# Patient Record
Sex: Male | Born: 1970 | Race: White | Hispanic: No | Marital: Married | Smoking: Current some day smoker
Health system: Southern US, Community
[De-identification: ages and names within clinical notes are randomized; demographics above are authoritative.]

## PROBLEM LIST (undated history)

## (undated) DIAGNOSIS — G4733 Obstructive sleep apnea (adult) (pediatric): Secondary | ICD-10-CM

## (undated) DIAGNOSIS — E079 Disorder of thyroid, unspecified: Secondary | ICD-10-CM

## (undated) DIAGNOSIS — F411 Generalized anxiety disorder: Secondary | ICD-10-CM

## (undated) DIAGNOSIS — R5383 Other fatigue: Secondary | ICD-10-CM

## (undated) DIAGNOSIS — E669 Obesity, unspecified: Secondary | ICD-10-CM

## (undated) HISTORY — DX: Obstructive sleep apnea (adult) (pediatric): G47.33

## (undated) HISTORY — DX: Obesity, unspecified: E66.9

## (undated) HISTORY — DX: Other fatigue: R53.83

## (undated) HISTORY — DX: Disorder of thyroid, unspecified: E07.9

## (undated) HISTORY — DX: Generalized anxiety disorder: F41.1

---

## 2008-07-22 ENCOUNTER — Ambulatory Visit: Payer: Self-pay | Admitting: Obstetrics and Gynecology

## 2014-11-06 ENCOUNTER — Encounter: Payer: Self-pay | Admitting: Family Medicine

## 2014-11-06 ENCOUNTER — Ambulatory Visit (INDEPENDENT_AMBULATORY_CARE_PROVIDER_SITE_OTHER): Payer: BLUE CROSS/BLUE SHIELD | Admitting: Family Medicine

## 2014-11-06 ENCOUNTER — Ambulatory Visit (INDEPENDENT_AMBULATORY_CARE_PROVIDER_SITE_OTHER): Payer: BLUE CROSS/BLUE SHIELD | Admitting: *Deleted

## 2014-11-06 VITALS — BP 112/80 | HR 99 | Temp 98.1°F | Ht 71.0 in | Wt 237.1 lb

## 2014-11-06 DIAGNOSIS — M545 Low back pain: Secondary | ICD-10-CM

## 2014-11-06 DIAGNOSIS — F411 Generalized anxiety disorder: Secondary | ICD-10-CM | POA: Insufficient documentation

## 2014-11-06 DIAGNOSIS — Z23 Encounter for immunization: Secondary | ICD-10-CM

## 2014-11-06 DIAGNOSIS — Z7689 Persons encountering health services in other specified circumstances: Secondary | ICD-10-CM

## 2014-11-06 DIAGNOSIS — E039 Hypothyroidism, unspecified: Secondary | ICD-10-CM

## 2014-11-06 DIAGNOSIS — Z7189 Other specified counseling: Secondary | ICD-10-CM

## 2014-11-06 HISTORY — DX: Generalized anxiety disorder: F41.1

## 2014-11-06 LAB — BASIC METABOLIC PANEL
BUN: 15 mg/dL (ref 6–23)
CO2: 26 mEq/L (ref 19–32)
CREATININE: 0.9 mg/dL (ref 0.4–1.5)
Calcium: 9.5 mg/dL (ref 8.4–10.5)
Chloride: 108 mEq/L (ref 96–112)
GFR: 98.1 mL/min (ref >60.00)
Glucose, Bld: 88 mg/dL (ref 70–99)
Potassium: 3.6 mEq/L (ref 3.5–5.1)
Sodium: 142 mEq/L (ref 135–145)

## 2014-11-06 LAB — TSH: TSH: 6.2 u[IU]/mL — AB (ref 0.35–4.50)

## 2014-11-06 LAB — HEMOGLOBIN A1C: HEMOGLOBIN A1C: 5.3 % (ref 4.6–6.5)

## 2014-11-06 NOTE — Progress Notes (Signed)
Pre visit review using our clinic review tool, if applicable. No additional management support is needed unless otherwise documented below in the visit note. 

## 2014-11-06 NOTE — Progress Notes (Signed)
HPI:  [name] [name] is here to establish care. Has not had a primary care doctor in 4-5 years.   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 Thyroid disease   . Fatigue     History reviewed. No pertinent past surgical history.  Family History  Problem Relation Age of Onset  . Hyperlipidemia Mother   . Hyperlipidemia Father   . Diabetes Father   . Diabetes Maternal Grandmother   . Hyperlipidemia Maternal Grandmother   . Heart disease Maternal Grandmother   . Thyroid disease Maternal Uncle     and maternal grandmother    History   Social History  . Marital Status: Married    Spouse Name: N/A    Number of Children: N/A  . Years of Education: N/A   Social History Main Topics  . Smoking status: Former Games developermoker  . Smokeless tobacco: None     Comment: smoked on and off for 10 years, quit in2005  . Alcohol Use: None  . Drug Use: None  . Sexual Activity: None   Other Topics Concern  . 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  EXAM:  Filed Vitals:   11/06/14 1440  BP: 112/80  Pulse: 99  Temp: 98.1 F (36.7 C)    Body mass index is 33.08 kg/(m^2).  GENERAL: vitals reviewed and listed above, alert, oriented, appears well hydrated and in no acute distress  HEENT: atraumatic, conjunttiva clear, no obvious abnormalities on inspection of external nose and ears  NECK: no obvious masses on inspection, no thyroid masses palpated  LUNGS: clear to auscultation bilaterally, no wheezes, rales or rhonchi, good air movement  CV: HRRR, no peripheral edema  MS: moves all extremities without noticeable abnormality  PSYCH: pleasant and cooperative, no obvious depression or anxiety  ASSESSMENT AND PLAN:  Discussed the following assessment and plan:  Generalized anxiety disorder - Plan: Basic metabolic panel, Hemoglobin A1c - mild, opted not to pursue tx as feels will improve with current transition  Hypothyroidism, unspecified hypothyroidism type - Plan: TSH, Basic metabolic panel, Hemoglobin A1c  Encounter to establish care - Plan: Basic metabolic panel, Hemoglobin A1c  Right low back pain, with sciatica presence unspecified -HEP, did not get to examine today due to time, will examine next visit  -  We reviewed the PMH, PSH, FH, SH, Meds and Allergies. -We provided refills for any medications we will prescribe as needed. -We addressed current concerns per orders and patient instructions. -We have asked for records for pertinent exams, studies, vaccines and notes from previous providers. -We have advised patient to follow up per instructions below.   -Patient advised to return or notify a doctor immediately if symptoms worsen or persist or new concerns arise.  There are no Patient Instructions  on file for this visit.   Kriste Basque R.

## 2014-11-06 NOTE — Patient Instructions (Signed)
BEFORE YOU LEAVE: -flu shot -labs -low back exercises -schedule follow up in 1 month  -We have ordered labs or studies at this visit. It can take up to 1-2 weeks for results and processing. We will contact you with instructions IF your results are abnormal. Normal results will be released to your [facility]. If you have not heard from us or can not find your results in [facility] in 2 weeks please contact our office.  -PLEASE SIGN UP FOR MYCHART TODAY   We recommend the following healthy lifestyle measures: - eat a healthy diet consisting of lots of vegetables, fruits, beans, nuts, seeds, healthy meats such as white chicken and fish and whole grains.  - avoid fried foods, fast food, processed foods, sodas, red meet and other fattening foods.  - get a least 150 minutes of aerobic exercise per week.

## 2014-11-10 ENCOUNTER — Encounter: Payer: Self-pay | Admitting: Family Medicine

## 2014-11-12 ENCOUNTER — Other Ambulatory Visit: Payer: Self-pay | Admitting: *Deleted

## 2014-11-12 MED ORDER — LEVOTHYROXINE SODIUM 50 MCG PO TABS: 50.0000 ug | ORAL_TABLET | Freq: Every day | ORAL | Status: DC

## 2014-11-12 NOTE — Telephone Encounter (Signed)
Rx done and the pt was informed via Mychart. 

## 2015-02-10 ENCOUNTER — Other Ambulatory Visit: Payer: Self-pay | Admitting: Family Medicine

## 2015-03-19 ENCOUNTER — Ambulatory Visit (INDEPENDENT_AMBULATORY_CARE_PROVIDER_SITE_OTHER): Payer: BLUE CROSS/BLUE SHIELD | Admitting: Family Medicine

## 2015-03-19 ENCOUNTER — Encounter: Payer: Self-pay | Admitting: Family Medicine

## 2015-03-19 VITALS — BP 100/72 | HR 71 | Temp 97.9°F | Ht 71.25 in | Wt 232.7 lb

## 2015-03-19 DIAGNOSIS — Z6832 Body mass index (BMI) 32.0-32.9, adult: Secondary | ICD-10-CM | POA: Diagnosis not present

## 2015-03-19 DIAGNOSIS — E039 Hypothyroidism, unspecified: Secondary | ICD-10-CM | POA: Diagnosis not present

## 2015-03-19 DIAGNOSIS — Z0000 Encounter for general adult medical examination without abnormal findings: Secondary | ICD-10-CM

## 2015-03-19 DIAGNOSIS — F39 Unspecified mood [affective] disorder: Secondary | ICD-10-CM

## 2015-03-19 LAB — LIPID PANEL
CHOL/HDL RATIO: 5
Cholesterol: 173 mg/dL (ref 0–200)
HDL: 36.7 mg/dL — ABNORMAL LOW (ref >39.00)
LDL Cholesterol: 114 mg/dL — ABNORMAL HIGH (ref 0–99)
NONHDL: 136.3
Triglycerides: 114 mg/dL (ref 0.0–149.0)
VLDL: 22.8 mg/dL (ref 0.0–40.0)

## 2015-03-19 LAB — TSH: TSH: 4.36 u[IU]/mL (ref 0.35–4.50)

## 2015-03-19 NOTE — Patient Instructions (Signed)
BEFORE YOU LEAVE: -labs -follow up in 4 months  Schedule appointment with Dr. Jason FilaBray  We recommend the following healthy lifestyle measures: - eat a healthy diet consisting of lots of vegetables, fruits, beans, nuts, seeds, healthy meats such as white chicken and fish   - avoid starches, sweets, fried foods, fast food, processed foods, sodas, red meet and other fattening foods.  - get a least 150 minutes of aerobic exercise per week.

## 2015-03-19 NOTE — Progress Notes (Signed)
Pre visit review using our clinic review tool, if applicable. No additional management support is needed unless otherwise documented below in the visit note. 

## 2015-03-19 NOTE — Progress Notes (Signed)
HPI:  Here for CPE:  -Concerns and/or follow up today:   Mild Hypothyroidism: -advised re-starting synthroid last visit due to symtpoms -reports: doing ok  -denies: constipation, palpitations weight changes, hot or cold intol  Depressed Mood: For at least 6 months -depressed mood, worries about everything, irritable Sleep disorder: yes Interest deficit/anhedonia: yes Guilt (worthlessness, hopelessness, regret):sometimes Energy deficit: yes Concentration deficit: yes Appetite disorder: yes a little Psychomotor retardation or agitation: sluggish Suicidality: no  -Diet: variety of foods, balance and well rounded, sometimes eats too much  -Exercise: no regular exercise  -Diabetes and Dyslipidemia Screening: FASTING  -Hx of HTN: no  -Vaccines: UTD  -sexual activity: yes, male partner, no new partners  -wants STI testing, Hep C screening (if born [id number]): declined  -FH colon or prstate ca: see FH Last colon cancer screening: n/a Last prostate ca screening: n/a  -Alcohol, Tobacco, drug use: see social history  Review of Systems - no fevers, unintentional weight loss, vision loss, hearing loss, chest pain, sob, hemoptysis, melena, hematochezia, hematuria, genital discharge, changing or concerning skin lesions, bleeding, bruising, loc, thoughts of self harm or SI  Past Medical History  Diagnosis Date  . Thyroid disease   . Fatigue     No past surgical history on file.  Family History  Problem Relation Age of Onset  . Hyperlipidemia Mother   . Hyperlipidemia Father   . Diabetes Father   . Diabetes Maternal Grandmother   . Hyperlipidemia Maternal Grandmother   . Heart disease Maternal Grandmother   . Thyroid disease Maternal Uncle     and maternal grandmother    History   Social History  . Marital Status: Married    Spouse Name: N/A  . Number of Children: N/A  . Years of Education: N/A   Social History Main Topics  . Smoking status: Former Games developermoker   . Smokeless tobacco: Not on file     Comment: smoked on and off for 10 years, quit in2005  . Alcohol Use: Not on file  . Drug Use: Not on file  . Sexual Activity: Not on file   Other Topics Concern  . 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  levothyroxine (SYNTHROID, LEVOTHROID) 50 MCG tablet, TAKE 1 TABLET BY MOUTH DAILY, Disp: 30 tablet, Rfl: 1  EXAM:  Filed Vitals:   03/19/15 0925  BP: 100/72  Pulse: 71  Temp: 97.9 F (36.6 C)  TempSrc: Oral  Height: 5' 11.25" (1.81 m)  Weight: 232 lb 11.2 oz (105.552 kg)    Estimated body mass index is 32.22 kg/(m^2) as calculated from the following:   Height as of this encounter: 5' 11.25" (1.81 m).   Weight as of this encounter: 232 lb 11.2 oz (105.552 kg).  GENERAL: vitals reviewed and listed below, alert, oriented, appears well hydrated and in no acute distress  HEENT: head atraumatic, PERRLA, normal appearance of eyes, ears, nose and mouth. moist mucus membranes.  NECK: supple, no masses or lymphadenopathy  LUNGS: clear to auscultation bilaterally, no rales, rhonchi or wheeze  CV: HRRR, no peripheral edema or cyanosis, normal pedal pulses  ABDOMEN: bowel  sounds normal, soft, non tender to palpation, no masses, no rebound or guarding  GU: declined  RECTAL: declined  SKIN: no rash or abnormal lesions  MS: normal gait, moves all extremities normally  NEURO: CN II-XII grossly intact, normal muscle strength and sensation to light touch on extremities  PSYCH: normal affect, pleasant and cooperative  ASSESSMENT AND PLAN:  Discussed the following assessment and plan:  Visit for preventive health examination - Plan: TSH, Lipid  Panel -Discussed and advised all [place] preventive services health task force level A and B recommendations for age, sex and risks. -Advised at least 150 minutes of exercise per week and a healthy diet low in saturated fats and sweets and consisting of fresh fruits and vegetables, lean meats such as fish and white chicken and whole grains. -FASTING labs, studies and vaccines per orders this encounter  Mood disorder -discussed options -he opted for trial CBT, Dr. Janalyn ShyBray's information provided  BMI 32.0-32.9,adult -diet and exercise advised  Hypothyroidism, unspecified hypothyroidism type -check TSH    Patient advised to return to clinic immediately if symptoms worsen or persist or new concerns.  Patient Instructions  BEFORE YOU LEAVE: -labs -follow up in 4 months  Schedule appointment with Dr. Jason FilaBray  We recommend the following healthy lifestyle measures: - eat a healthy diet consisting of lots of vegetables, fruits, beans, nuts, seeds, healthy meats such as white chicken and fish   - avoid starches, sweets, fried foods, fast food, processed foods, sodas, red meet and other fattening foods.  - get a least 150 minutes of aerobic exercise per week.      No Follow-up on file.   Kriste BasqueKIM, Ajooni Karam R.

## 2015-03-26 ENCOUNTER — Other Ambulatory Visit: Payer: Self-pay | Admitting: Family Medicine

## 2015-03-26 DIAGNOSIS — E039 Hypothyroidism, unspecified: Secondary | ICD-10-CM

## 2015-05-19 ENCOUNTER — Other Ambulatory Visit: Payer: Self-pay | Admitting: Family Medicine

## 2015-06-15 ENCOUNTER — Encounter: Payer: Self-pay | Admitting: Family Medicine

## 2015-06-15 ENCOUNTER — Other Ambulatory Visit: Payer: Self-pay | Admitting: *Deleted

## 2015-06-15 MED ORDER — LEVOTHYROXINE SODIUM 50 MCG PO TABS: 50.0000 ug | ORAL_TABLET | Freq: Every day | ORAL | Status: DC

## 2015-06-15 NOTE — Telephone Encounter (Signed)
Rx done and pt informed via Mychart. 

## 2015-07-29 ENCOUNTER — Encounter: Payer: Self-pay | Admitting: Family Medicine

## 2015-07-29 ENCOUNTER — Other Ambulatory Visit: Payer: Self-pay | Admitting: *Deleted

## 2015-07-29 DIAGNOSIS — R0683 Snoring: Secondary | ICD-10-CM

## 2015-08-08 ENCOUNTER — Other Ambulatory Visit: Payer: Self-pay | Admitting: Family Medicine

## 2015-08-18 ENCOUNTER — Ambulatory Visit (INDEPENDENT_AMBULATORY_CARE_PROVIDER_SITE_OTHER): Payer: BLUE CROSS/BLUE SHIELD | Admitting: Family Medicine

## 2015-08-18 ENCOUNTER — Encounter: Payer: Self-pay | Admitting: Family Medicine

## 2015-08-18 ENCOUNTER — Other Ambulatory Visit: Payer: Self-pay | Admitting: *Deleted

## 2015-08-18 ENCOUNTER — Ambulatory Visit (INDEPENDENT_AMBULATORY_CARE_PROVIDER_SITE_OTHER)
Admission: RE | Admit: 2015-08-18 | Discharge: 2015-08-18 | Disposition: A | Discharge status: Home / Self Care | Payer: BLUE CROSS/BLUE SHIELD | Source: Ambulatory Visit | Attending: Family Medicine | Admitting: Family Medicine

## 2015-08-18 VITALS — BP 110/82 | HR 87 | Temp 98.1°F | Ht 71.25 in | Wt 228.6 lb

## 2015-08-18 DIAGNOSIS — E039 Hypothyroidism, unspecified: Secondary | ICD-10-CM

## 2015-08-18 DIAGNOSIS — J209 Acute bronchitis, unspecified: Secondary | ICD-10-CM

## 2015-08-18 DIAGNOSIS — J069 Acute upper respiratory infection, unspecified: Secondary | ICD-10-CM

## 2015-08-18 LAB — TSH: TSH: 5.41 u[IU]/mL — AB (ref 0.35–4.50)

## 2015-08-18 MED ORDER — DOXYCYCLINE HYCLATE 100 MG PO TABS: 100.0000 mg | ORAL_TABLET | Freq: Two times a day (BID) | ORAL | Status: DC

## 2015-08-18 MED ORDER — PREDNISONE 20 MG PO TABS: 40.0000 mg | ORAL_TABLET | Freq: Every day | ORAL | Status: DC

## 2015-08-18 NOTE — Progress Notes (Signed)
Pre visit review using our clinic review tool, if applicable. No additional management support is needed unless otherwise documented below in the visit note. 

## 2015-08-18 NOTE — Addendum Note (Signed)
Addended by: Johnella MoloneyFUNDERBURK, JO A on: 08/18/2015 10:09 AM   Modules accepted: Orders

## 2015-08-18 NOTE — Telephone Encounter (Signed)
Rx done and the pt was informed. 

## 2015-08-18 NOTE — Patient Instructions (Signed)
BEFORE YOU LEAVE: -lab -xray sheet  Go get xray today  Start the prednisone  INSTRUCTIONS FOR UPPER RESPIRATORY INFECTION:  -plenty of rest and fluids  -nasal saline wash 2-3 times daily (use prepackaged nasal saline or bottled/distilled water if making your own)   -can use AFRIN nasal spray for drainage and nasal congestion - but do NOT use longer then 3-4 days  -can use tylenol (in no history of liver disease) or ibuprofen (if no history of kidney disease, bowel bleeding or significant heart disease) as directed for aches and sorethroat  -in the winter time, using a humidifier at night is helpful (please follow cleaning instructions)  -if you are taking a cough medication - use only as directed, may also try a teaspoon of honey to coat the throat and throat lozenges. If given a cough medication with codeine or hydrocodone or other narcotic please be advised that this contains a strong and  potentially addicting medication. Please follow instructions carefully, take as little as possible and only use AS NEEDED for severe cough. Discuss potential side effects with your pharmacy. Please do not drive or operate machinery while taking these types of medications. Please do not take other sedating medications, drugs or alcohol while taking this medication without discussing with your doctor.  -for sore throat, salt water gargles can help  -follow up if you have fevers, facial pain, tooth pain, difficulty breathing or are worsening or symptoms persist longer then expected  Upper Respiratory Infection, Adult An upper respiratory infection (URI) is also known as the common cold. It is often caused by a type of germ (virus). Colds are easily spread (contagious). You can pass it to others by kissing, coughing, sneezing, or drinking out of the same glass. Usually, you get better in 1 to 3  weeks.  However, the cough can last for even longer. HOME CARE   Only take medicine as told by your doctor.  Follow instructions provided above.  Drink enough water and fluids to keep your pee (urine) clear or pale yellow.  Get plenty of rest.  Return to work when your temperature is < 100 for 24 hours or as told by your doctor. You may use a face mask and wash your hands to stop your cold from spreading. GET HELP RIGHT AWAY IF:   You are getting worse.  You have questions about your medicine.  You have chills, shortness of breath, or red spit (mucus).  You have pain in the face for more then 1-2 days, especially when you bend forward.  You have a fever, puffy (swollen) neck, pain when you swallow, or white spots in the back of your throat.  You have a bad headache, ear pain, sinus pain, or chest pain.  You have a high-pitched whistling sound when you breathe in and out (wheezing).  You cough up blood.  You have sore muscles or a stiff neck. MAKE SURE YOU:   Understand these instructions.  Will watch your condition.  Will get help right away if you are not doing well or get worse. Document Released: 04/03/2008 Document Revised: 01/08/2012 Document Reviewed: 01/21/2014 [facility] Patient Information 2015 [place], [place]. This information is not intended to replace advice given to you by your health care provider. Make sure you discuss any questions you have with your health care provider.

## 2015-08-18 NOTE — Progress Notes (Signed)
HPI:  URI: -started: 2 days ago -symptoms:nasal congestion, sore throat, cough, ? Mild chest tightness or SOB - feels like can't take a deep breath as well -denies:fever, sinus pain, NVD, tooth pain, CP with activity or DOE, long car or plane rides, surgeries -has tried: nothing -sick contacts/travel/risks: denies flu exposure, tick exposure or or Ebola risks -his whole family was sick and he was sick with a bad viral resp infection about one month ago and he also had some mild SOB with this then this had pretty much resolved before he got sick again a few days ago  ROS: See pertinent positives and negatives per HPI.  Past Medical History  Diagnosis Date  . Thyroid disease   . Fatigue     No past surgical history on file.  Family History  Problem Relation Age of Onset  . Hyperlipidemia Mother   . Hyperlipidemia Father   . Diabetes Father   . Diabetes Maternal Grandmother   . Hyperlipidemia Maternal Grandmother   . Heart disease Maternal Grandmother   . Thyroid disease Maternal Uncle     and maternal grandmother    Social History   Social History  . Marital Status: Married    Spouse Name: N/A  . Number of Children: N/A  . Years of Education: N/A   Social History Main Topics  . Smoking status: Former Games developer  . Smokeless tobacco: None     Comment: smoked on and off for 10 years, quit in2005  . Alcohol Use: None  . Drug Use: None  . Sexual Activity: Not Asked   Other Topics Concern  . 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  levothyroxine (SYNTHROID, LEVOTHROID) 50 MCG tablet, Take 1 tablet by mouth  daily, Disp: 90 tablet, Rfl: 0 .  predniSONE (DELTASONE) 20 MG  tablet, Take 2 tablets (40 mg total) by mouth daily with breakfast., Disp: 8 tablet, Rfl: 0  EXAM:  Filed Vitals:   08/18/15 0855  BP: 110/82  Pulse: 87  Temp: 98.1 F (36.7 C)    Body mass index is 31.65 kg/(m^2).  GENERAL: vitals reviewed and listed above, alert, oriented, appears well hydrated and in no acute distress  HEENT: atraumatic, conjunttiva clear, no obvious abnormalities on inspection of external nose and ears, normal appearance of ear canals and TMs, clear nasal congestion, mild post oropharyngeal erythema with PND, no tonsillar edema or exudate, no sinus TTP  NECK: no obvious masses on inspection  LUNGS: clear to auscultation bilaterally, no wheezes, rales or rhonchi, good air movement, O2 sats normal on RA, no signs of resp distress  CV: HRRR, no peripheral edema  MS: moves all extremities without noticeable abnormality  PSYCH: pleasant and cooperative, no obvious depression or anxiety  ASSESSMENT AND PLAN:  Discussed the following assessment and plan:  Acute upper respiratory infection  Acute bronchitis, unspecified organism  Hypothyroidism, unspecified hypothyroidism type - Plan: TSH  -given HPI and exam findings today, a serious infection or illness is unlikely. We discussed potential etiologies (including serious etiologies thought we both agreed these are unlikely), with VURI being most likely, and advised  supportive care and monitoring. We discussed treatment side effects, likely course, antibiotic misuse, transmission, and signs of developing a serious illness. -will get CXR to exclude PNA -check tsh as is doe for check -start short course prednisone (risks discussed)- advised to follow up if worsening or not improving as expected -of course, we advised to return or notify a doctor immediately if symptoms worsen or persist or new concerns arise.    Patient Instructions  BEFORE YOU LEAVE: -lab -xray sheet  Go get xray today  Start the  prednisone  INSTRUCTIONS FOR UPPER RESPIRATORY INFECTION:  -plenty of rest and fluids  -nasal saline wash 2-3 times daily (use prepackaged nasal saline or bottled/distilled water if making your own)   -can use AFRIN nasal spray for drainage and nasal congestion - but do NOT use longer then 3-4 days  -can use tylenol (in no history of liver disease) or ibuprofen (if no history of kidney disease, bowel bleeding or significant heart disease) as directed for aches and sorethroat  -in the winter time, using a humidifier at night is helpful (please follow cleaning instructions)  -if you are taking a cough medication - use only as directed, may also try a teaspoon of honey to coat the throat and throat lozenges. If given a cough medication with codeine or hydrocodone or other narcotic please be advised that this contains a strong and  potentially addicting medication. Please follow instructions carefully, take as little as possible and only use AS NEEDED for severe cough. Discuss potential side effects with your pharmacy. Please do not drive or operate machinery while taking these types of medications. Please do not take other sedating medications, drugs or alcohol while taking this medication without discussing with your doctor.  -for sore throat, salt water gargles can help  -follow up if you have fevers, facial pain, tooth pain, difficulty breathing or are worsening or symptoms persist longer then expected  Upper Respiratory Infection, Adult An upper respiratory infection (URI) is also known as the common cold. It is often caused by a type of germ (virus). Colds are easily spread (contagious). You can pass it to others by kissing, coughing, sneezing, or drinking out of the same glass. Usually, you get better in 1 to 3  weeks.  However, the cough can last for even longer. HOME CARE   Only take medicine as told by your doctor. Follow instructions provided above.  Drink enough water and fluids to  keep your pee (urine) clear or pale yellow.  Get plenty of rest.  Return to work when your temperature is < 100 for 24 hours or as told by your doctor. You may use a face mask and wash your hands to stop your cold from spreading. GET HELP RIGHT AWAY IF:   You are getting worse.  You have questions about your medicine.  You have chills, shortness of breath, or red spit (mucus).  You have pain in the face for more then 1-2 days, especially when you bend forward.  You have a fever, puffy (swollen) neck, pain when you swallow, or white spots in the back of your throat.  You have a bad headache, ear pain, sinus pain, or chest pain.  You have a high-pitched whistling sound when you breathe in and out (wheezing).  You cough up blood.  You have sore muscles or a stiff neck. MAKE SURE YOU:   Understand these instructions.  Will watch your condition.  Will get help right away if you are not  doing well or get worse. Document Released: 04/03/2008 Document Revised: 01/08/2012 Document Reviewed: 01/21/2014 [facility]Exit[facility] Patient Information 2015 [place]ExitCare, [place]LL[place]. This information is not intended to replace advice given to you by your health care provider. Make sure you discuss any questions you have with your health care provider.      Kriste BasqueKIM, HANNAH R.

## 2015-08-20 ENCOUNTER — Ambulatory Visit: Payer: BLUE CROSS/BLUE SHIELD | Admitting: Family Medicine

## 2015-09-15 MED ORDER — LEVOTHYROXINE SODIUM 75 MCG PO TABS: 75.0000 ug | ORAL_TABLET | Freq: Every day | ORAL | Status: DC

## 2015-09-15 NOTE — Addendum Note (Signed)
Addended by: Johnella MoloneyFUNDERBURK, JO A on: 09/15/2015 01:02 PM   Modules accepted: Orders, Medications

## 2015-09-21 ENCOUNTER — Encounter: Payer: Self-pay | Admitting: Family Medicine

## 2015-09-21 ENCOUNTER — Ambulatory Visit (INDEPENDENT_AMBULATORY_CARE_PROVIDER_SITE_OTHER): Payer: BLUE CROSS/BLUE SHIELD | Admitting: Family Medicine

## 2015-09-21 VITALS — BP 104/80 | HR 104 | Temp 98.0°F | Ht 71.25 in | Wt 233.3 lb

## 2015-09-21 DIAGNOSIS — F411 Generalized anxiety disorder: Secondary | ICD-10-CM | POA: Diagnosis not present

## 2015-09-21 DIAGNOSIS — K219 Gastro-esophageal reflux disease without esophagitis: Secondary | ICD-10-CM | POA: Diagnosis not present

## 2015-09-21 DIAGNOSIS — R0789 Other chest pain: Secondary | ICD-10-CM

## 2015-09-21 MED ORDER — ESOMEPRAZOLE MAGNESIUM 20 MG PO CPDR: 20.0000 mg | DELAYED_RELEASE_CAPSULE | Freq: Every day | ORAL | Status: DC

## 2015-09-21 NOTE — Progress Notes (Signed)
Pre visit review using our clinic review tool, if applicable. No additional management support is needed unless otherwise documented below in the visit note. 

## 2015-09-21 NOTE — Patient Instructions (Signed)
Please schedule follow up in 1 month  Take the Nexium daily for 2 weeks  Please schedule counseling to help with stress  We placed a referral for you as discussed for the exercise stress test. It usually takes about 1-2 weeks to process and schedule this referral. If you have not heard from us regarding this appointment in 2 weeks please contact our office.

## 2015-09-21 NOTE — Progress Notes (Signed)
HPI:  [name] [name] is a 44 yo M with a PMH hypothyroidism, on replacement, and a resp infection with possible mild pneumonia last month, treated with doxy and prednisone here today for an acute visit for chest pain. Reports has been under a lot of stress as is in the process of looking for a job and has had 4 jobs fall through and also is in marriage counseling. At night his is anxious and thinking about this and develops L sided chest pain that always occurs when lying in bed. Never occurs with exercise. He also has had worsening GERD with several bad occassions after eating salsa in the last few weeks. He reports complete resolutions of cough, congestion and SOB. Denies: fevers, chills, malaise, depression, dysphagia, vomiting, DOE, orthopnea.  ROS: See pertinent positives and negatives per HPI.  Past Medical History  Diagnosis Date  . Thyroid disease   . Fatigue     No past surgical history on file.  Family History  Problem Relation Age of Onset  . Hyperlipidemia Mother   . Hyperlipidemia Father   . Diabetes Father   . Diabetes Maternal Grandmother   . Hyperlipidemia Maternal Grandmother   . Heart disease Maternal Grandmother   . Thyroid disease Maternal Uncle     and maternal grandmother    Social History   Social History  . Marital Status: Married    Spouse Name: N/A  . Number of Children: N/A  . Years of Education: N/A   Social History Main Topics  . Smoking status: Former Games developer  . Smokeless tobacco: None     Comment: smoked on and off for 10 years, quit in2005  . Alcohol Use: None  . Drug Use: None  . Sexual Activity: Not Asked   Other Topics Concern  . 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  levothyroxine (SYNTHROID, LEVOTHROID) 75 MCG tablet, Take 1 tablet (75 mcg total) by mouth daily., Disp: 90 tablet, Rfl: 1 .  esomeprazole (NEXIUM) 20 MG capsule, Take 1 capsule (20 mg total) by mouth daily at 12 noon., Disp: 14 capsule, Rfl: 0  EXAM:  Filed Vitals:   09/21/15 1430  BP: 104/80  Pulse: 104  Temp: 98 F (36.7 C)    Body mass index is 32.3 kg/(m^2).  GENERAL: vitals reviewed and listed above, alert, oriented, appears well hydrated and in no acute distress  HEENT: atraumatic, conjunttiva clear, no obvious abnormalities on inspection of external nose and ears  NECK: no obvious masses on inspection  LUNGS: clear to auscultation bilaterally, no wheezes, rales or rhonchi, good air movement  CV: HRRR, no peripheral edema  MS: moves all extremities without noticeable abnormality, no chest wall pain  PSYCH: pleasant and cooperative, no obvious depression or anxiety  ASSESSMENT AND PLAN:  Discussed the following assessment and plan:  Atypical chest pain - Plan: EKG 12-Lead  Gastroesophageal reflux disease, esophagitis presence not specified  Generalized anxiety disorder  -this may be multifactorial and based on the symptoms sound more likely to be coming from GERD and/or anxiety -he does not want to take medications for anxiety - opted  for CBT -nexium short course for GERD -EKG with NSR -discussed repeating xray and may do if symptoms persist thought symptoms of PNA resolved -exercise stress test as he is worried  -Patient advised to return or notify a doctor immediately if symptoms worsen or persist or new concerns arise.  Patient Instructions  Please schedule follow up in 1 month  Take the Nexium daily for 2 weeks  Please schedule counseling to help with stress  We placed a referral for you as discussed for the exercise stress test. It usually takes about 1-2 weeks to process and schedule this referral. If you have not heard  from us regarding this appointment in 2 weeks please contact our office.      Kriste BasqueKIM, HANNAH R.

## 2015-10-19 ENCOUNTER — Ambulatory Visit (INDEPENDENT_AMBULATORY_CARE_PROVIDER_SITE_OTHER): Payer: BLUE CROSS/BLUE SHIELD | Admitting: Family Medicine

## 2015-10-19 ENCOUNTER — Other Ambulatory Visit: Payer: Self-pay | Admitting: Family Medicine

## 2015-10-19 ENCOUNTER — Ambulatory Visit (INDEPENDENT_AMBULATORY_CARE_PROVIDER_SITE_OTHER)
Admission: RE | Admit: 2015-10-19 | Discharge: 2015-10-19 | Disposition: A | Discharge status: Home / Self Care | Payer: BLUE CROSS/BLUE SHIELD | Source: Ambulatory Visit | Attending: Family Medicine | Admitting: Family Medicine

## 2015-10-19 ENCOUNTER — Encounter: Payer: Self-pay | Admitting: Family Medicine

## 2015-10-19 VITALS — BP 102/76 | HR 96 | Temp 97.6°F | Ht 71.25 in | Wt 228.6 lb

## 2015-10-19 DIAGNOSIS — R042 Hemoptysis: Secondary | ICD-10-CM

## 2015-10-19 DIAGNOSIS — J988 Other specified respiratory disorders: Secondary | ICD-10-CM

## 2015-10-19 MED ORDER — HYDROCODONE-HOMATROPINE 5-1.5 MG/5ML PO SYRP: 5.0000 mL | ORAL_SOLUTION | Freq: Two times a day (BID) | ORAL | Status: DC | PRN

## 2015-10-19 MED ORDER — AMOXICILLIN-POT CLAVULANATE 875-125 MG PO TABS: 1.0000 | ORAL_TABLET | Freq: Two times a day (BID) | ORAL | Status: DC

## 2015-10-19 NOTE — Patient Instructions (Signed)
BEFORE YOU LEAVE: -xray sheet  Go get xray - treatment pending results

## 2015-10-19 NOTE — Progress Notes (Signed)
Pre visit review using our clinic review tool, if applicable. No additional management support is needed unless otherwise documented below in the visit note. 

## 2015-10-19 NOTE — Progress Notes (Signed)
HPI:  Cough/Congestion: -started: 1 week ago, worsening the last 3-4 days -symptoms:nasal congestion, sore throat, cough, sinus pain, upper tooth pain, blood in sputum this morning -denies:fever, SOB, NVD -has tried: musinex -sick contacts/travel/risks: denies flu exposure, tick exposure or or Ebola risks, TB risks -Hx of: allergies ROS: See pertinent positives and negatives per HPI.  Past Medical History  Diagnosis Date  . Thyroid disease   . Fatigue     No past surgical history on file.  Family History  Problem Relation Age of Onset  . Hyperlipidemia Mother   . Hyperlipidemia Father   . Diabetes Father   . Diabetes Maternal Grandmother   . Hyperlipidemia Maternal Grandmother   . Heart disease Maternal Grandmother   . Thyroid disease Maternal Uncle     and maternal grandmother    Social History   Social History  . Marital Status: Married    Spouse Name: N/A  . Number of Children: N/A  . Years of Education: N/A   Social History Main Topics  . Smoking status: Former Games developermoker  . Smokeless tobacco: None     Comment: smoked on and off for 10 years, quit in2005  . Alcohol Use: None  . Drug Use: None  . Sexual Activity: Not Asked   Other Topics Concern  . 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  levothyroxine (SYNTHROID, LEVOTHROID) 75 MCG tablet, Take 1 tablet (75 mcg total) by mouth daily., Disp: 90 tablet, Rfl: 1 .  Pseudoephedrine-Guaifenesin (MUCINEX D PO), Take by mouth as needed., Disp: , Rfl:  .  HYDROcodone-homatropine (HYCODAN) 5-1.5 MG/5ML syrup, Take 5 mLs by mouth every 12 (twelve) hours as needed for cough., Disp: 120 mL, Rfl: 0  EXAM:  Filed Vitals:    10/19/15 1117  BP: 102/76  Pulse: 96  Temp: 97.6 F (36.4 C)    Body mass index is 31.65 kg/(m^2).  GENERAL: vitals reviewed and listed above, alert, oriented, appears well hydrated and in no acute distress  HEENT: atraumatic, conjunttiva clear, no obvious abnormalities on inspection of external nose and ears, normal appearance of ear canals and TMs, clear nasal congestion, mild post oropharyngeal erythema with PND, no tonsillar edema or exudate, no sinus TTP  NECK: no obvious masses on inspection  LUNGS: clear to auscultation bilaterally, no wheezes, rales or rhonchi, good air movement  CV: HRRR, no peripheral edema  MS: moves all extremities without noticeable abnormality  PSYCH: pleasant and cooperative, no obvious depression or anxiety  ASSESSMENT AND PLAN:  Discussed the following assessment and plan:  Respiratory infection - Plan: DG Chest 2 View  Hemoptysis - Plan: DG Chest 2 View  -we discussed possible serious and likely etiologies, workup and treatment, treatment risks and return precautions -after this discussion, Caryn BeeKevin opted for aabx for possible upper or lower resp infections - discussed viral etiology may also be possible but given he feels is worsening and has sinus pain opted for abx - will choose abx after CXR -cough medication provided -follow up advised as needed -of course, we advised Caryn BeeKevin  to return or notify a doctor immediately if symptoms worsen  or persist or new concerns arise.    Patient Instructions  BEFORE YOU LEAVE: -xray sheet  Go get xray - treatment pending results     Joren Rehm R.

## 2015-10-22 IMAGING — DX DG CHEST 2V
2 series · 2 of 2 positions shown · non-contrast
Comparison: None.

CLINICAL DATA: Cough and congestion.

EXAM:
CHEST  2 VIEW

[chest lat]
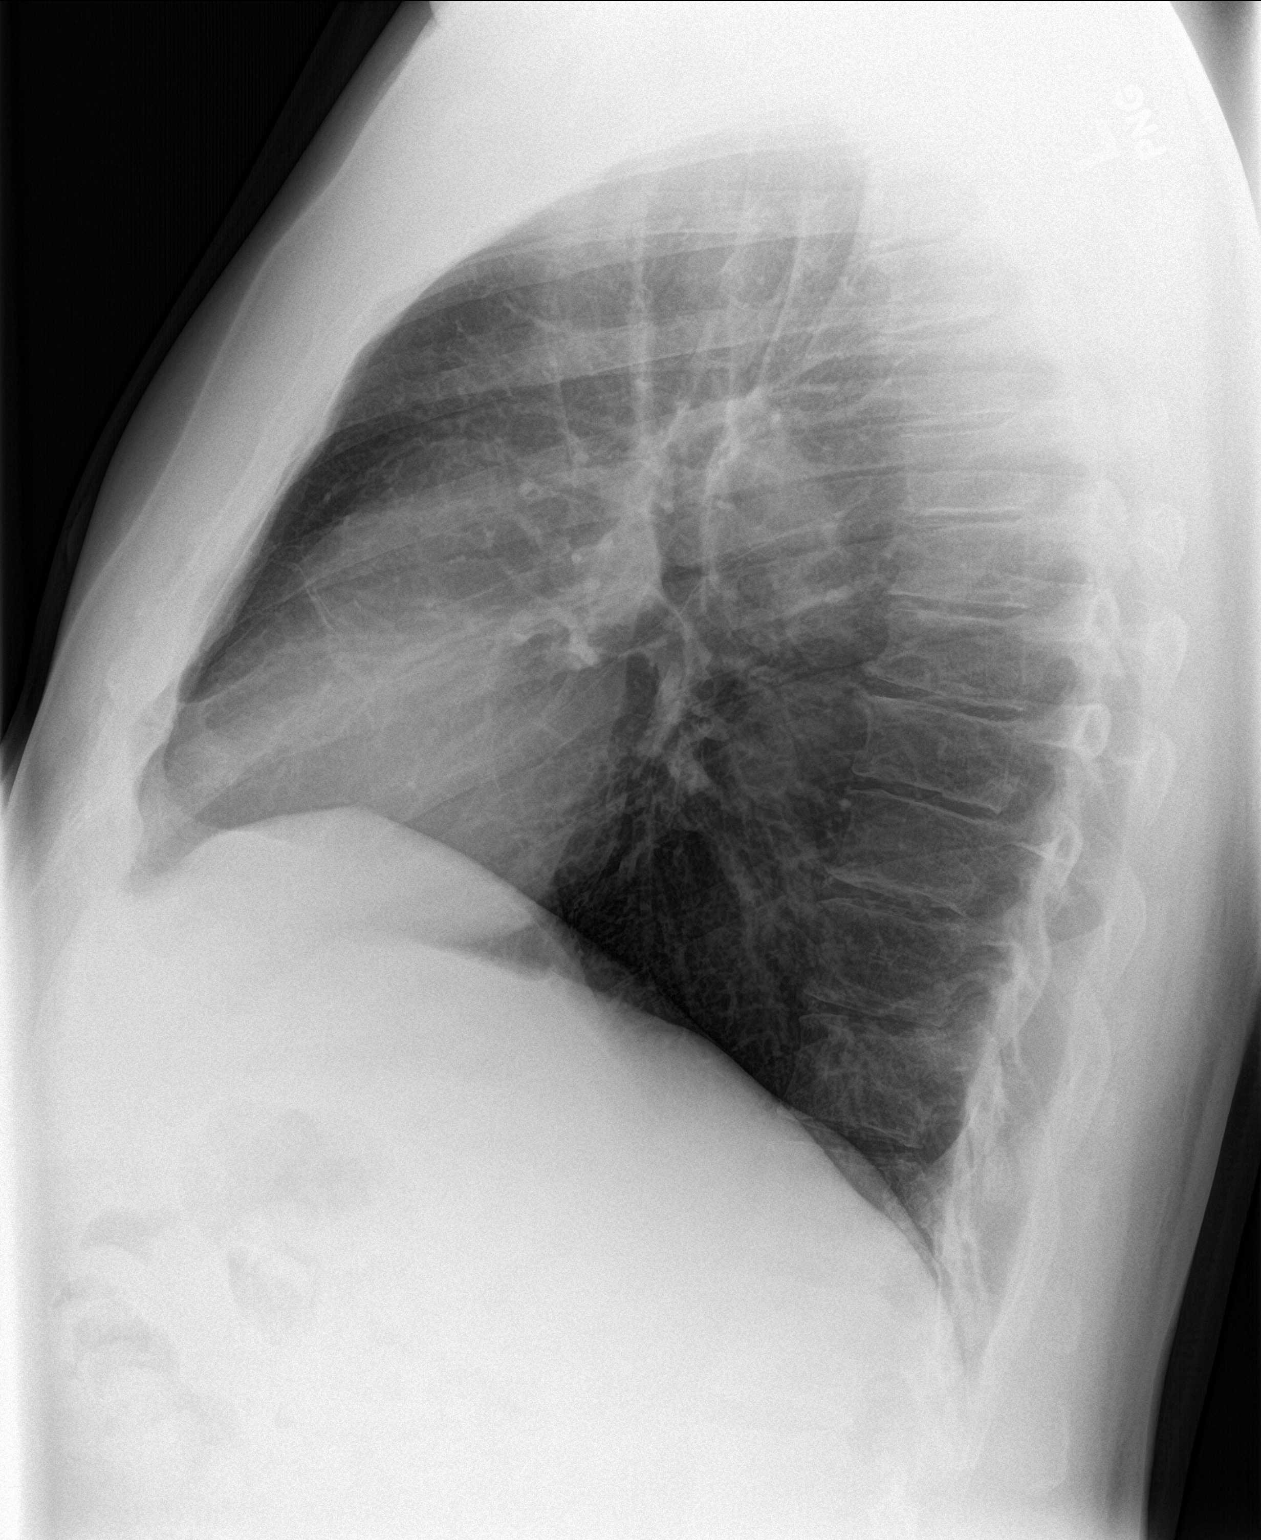

[chest pa]
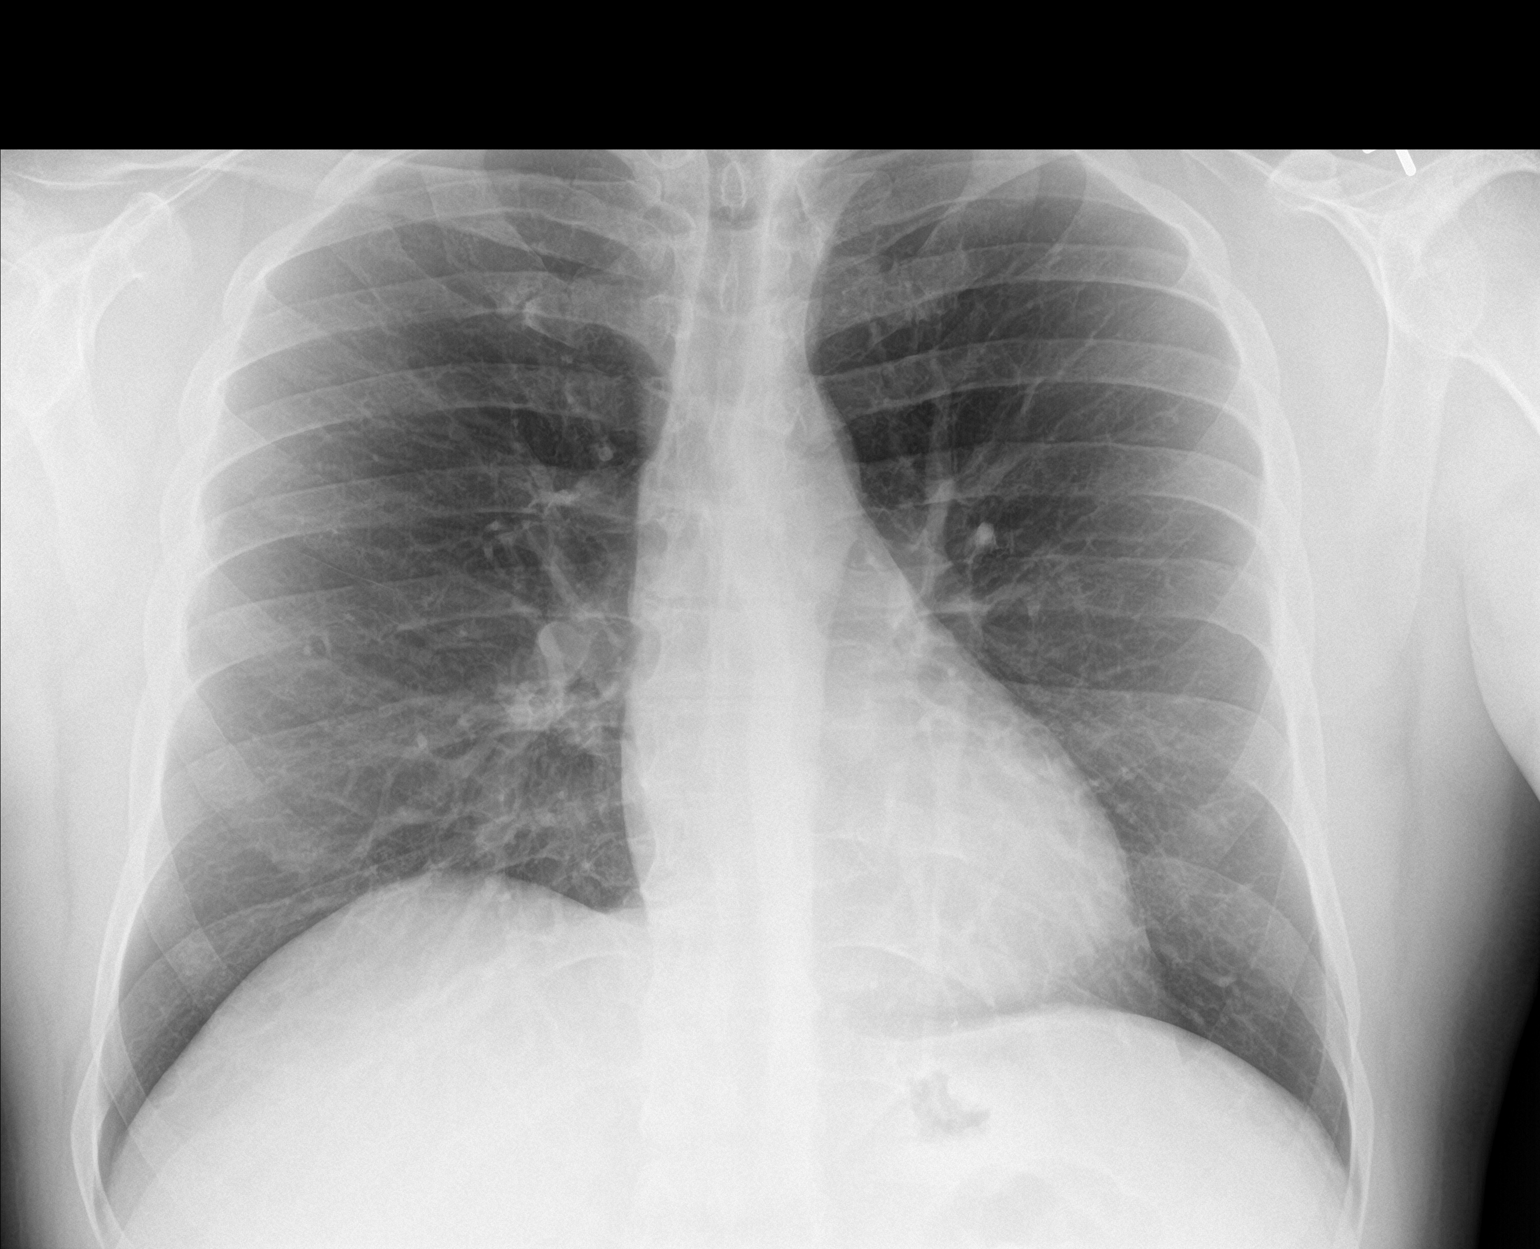

[2 of 2 positions shown; findings below may reference images not displayed]

FINDINGS: Mediastinum and hilar structures normal. Mild right middle lobe
subsegmental atelectasis and or infiltrate. Heart size normal. No
pleural effusion or pneumothorax.
IMPRESSION: Mild right middle lobe subsegmental atelectasis and or infiltrate.

## 2015-10-26 ENCOUNTER — Ambulatory Visit (INDEPENDENT_AMBULATORY_CARE_PROVIDER_SITE_OTHER): Payer: BLUE CROSS/BLUE SHIELD | Admitting: Family Medicine

## 2015-10-26 ENCOUNTER — Encounter: Payer: Self-pay | Admitting: Family Medicine

## 2015-10-26 VITALS — BP 110/80 | HR 89 | Temp 97.3°F | Ht 71.25 in | Wt 227.2 lb

## 2015-10-26 DIAGNOSIS — R05 Cough: Secondary | ICD-10-CM | POA: Diagnosis not present

## 2015-10-26 DIAGNOSIS — F411 Generalized anxiety disorder: Secondary | ICD-10-CM

## 2015-10-26 DIAGNOSIS — E039 Hypothyroidism, unspecified: Secondary | ICD-10-CM | POA: Diagnosis not present

## 2015-10-26 DIAGNOSIS — E785 Hyperlipidemia, unspecified: Secondary | ICD-10-CM | POA: Diagnosis not present

## 2015-10-26 DIAGNOSIS — R059 Cough, unspecified: Secondary | ICD-10-CM

## 2015-10-26 NOTE — Progress Notes (Signed)
Pre visit review using our clinic review tool, if applicable. No additional management support is needed unless otherwise documented below in the visit note. 

## 2015-10-26 NOTE — Patient Instructions (Signed)
BEFORE YOU LEAVE: -lab only visit in 1 month - please come fasting -follow up in 4 months   Consider the therapy apps (cognitive behavioral therapy) icouch or pacifica

## 2015-10-26 NOTE — Progress Notes (Signed)
  HPI:   Follow up:  GAD with panic: -struggling with job search and in marriage counseling -declined medications but opted to try CBT - was not sure he could afford - feels is doing better -no SI or thoughts of self harm  Recovering from recent URI: -CXR normal -reports is doing great  GERD: -did short course PPI - resolved  Hypothyroidism/HLD: -needs recheck in january  ROS: See pertinent positives and negatives per HPI.  Past Medical History  Diagnosis Date  . Thyroid disease   . Fatigue     No past surgical history on file.  Family History  Problem Relation Age of Onset  . Hyperlipidemia Mother   . Hyperlipidemia Father   . Diabetes Father   . Diabetes Maternal Grandmother   . Hyperlipidemia Maternal Grandmother   . Heart disease Maternal Grandmother   . Thyroid disease Maternal Uncle     and maternal grandmother    Social History   Social History  . Marital Status: Married    Spouse Name: N/A  . Number of Children: N/A  . Years of Education: N/A   Social History Main Topics  . Smoking status: Former Games developermoker  . Smokeless tobacco: None     Comment: smoked on and off for 10 years, quit in2005  . Alcohol Use: None  . Drug Use: None  . Sexual Activity: Not Asked   Other Topics Concern  . 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  amoxicillin-clavulanate (AUGMENTIN) 875-125 MG tablet, Take 1 tablet by mouth 2 (two) times daily., Disp: 20 tablet, Rfl: 0 .  HYDROcodone-homatropine (HYCODAN) 5-1.5 MG/5ML syrup, Take 5 mLs by mouth every 12 (twelve) hours as needed for cough., Disp: 120 mL, Rfl: 0 .  levothyroxine (SYNTHROID, LEVOTHROID) 75 MCG tablet, Take 1  tablet (75 mcg total) by mouth daily., Disp: 90 tablet, Rfl: 1 .  Pseudoephedrine-Guaifenesin (MUCINEX D PO), Take by mouth as needed., Disp: , Rfl:   EXAM:  Filed Vitals:   10/26/15 1426  BP: 110/80  Pulse: 89  Temp: 97.3 F (36.3 C)    Body mass index is 31.46 kg/(m^2).  GENERAL: vitals reviewed and listed above, alert, oriented, appears well hydrated and in no acute distress  HEENT: atraumatic, conjunttiva clear, no obvious abnormalities on inspection of external nose and ears  NECK: no obvious masses on inspection  LUNGS: clear to auscultation bilaterally, no wheezes, rales or rhonchi, good air movement  CV: HRRR, no peripheral edema  MS: moves all extremities without noticeable abnormality  PSYCH: pleasant and cooperative, no obvious depression or anxiety  ASSESSMENT AND PLAN:  Discussed the following assessment and plan:  Generalized anxiety disorder  Hypothyroidism, unspecified hypothyroidism type  Cough  Hyperlipemia  -Patient advised to return or notify a doctor immediately if symptoms worsen or persist or new concerns arise.  Patient Instructions  BEFORE YOU LEAVE: -lab only visit in 1 month - please come fasting -follow up in 4 months   Consider the therapy apps (cognitive behavioral therapy) icouch or Marlane Hatcherpacifica     KIM, HANNAH R.

## 2015-11-26 ENCOUNTER — Other Ambulatory Visit: Payer: BLUE CROSS/BLUE SHIELD

## 2015-11-29 ENCOUNTER — Other Ambulatory Visit (INDEPENDENT_AMBULATORY_CARE_PROVIDER_SITE_OTHER): Payer: BLUE CROSS/BLUE SHIELD

## 2015-11-29 DIAGNOSIS — E785 Hyperlipidemia, unspecified: Secondary | ICD-10-CM | POA: Diagnosis not present

## 2015-11-29 DIAGNOSIS — E039 Hypothyroidism, unspecified: Secondary | ICD-10-CM

## 2015-11-29 LAB — LIPID PANEL
CHOLESTEROL: 158 mg/dL (ref 0–200)
HDL: 41.8 mg/dL (ref >39.00)
LDL Cholesterol: 102 mg/dL — ABNORMAL HIGH (ref 0–99)
NONHDL: 116.02
TRIGLYCERIDES: 70 mg/dL (ref 0.0–149.0)
Total CHOL/HDL Ratio: 4
VLDL: 14 mg/dL (ref 0.0–40.0)

## 2015-11-29 LAB — TSH: TSH: 4.43 u[IU]/mL (ref 0.35–4.50)

## 2015-11-30 ENCOUNTER — Other Ambulatory Visit: Payer: BLUE CROSS/BLUE SHIELD

## 2015-12-02 ENCOUNTER — Ambulatory Visit (INDEPENDENT_AMBULATORY_CARE_PROVIDER_SITE_OTHER): Payer: BLUE CROSS/BLUE SHIELD | Admitting: Internal Medicine

## 2015-12-02 ENCOUNTER — Encounter: Payer: Self-pay | Admitting: Internal Medicine

## 2015-12-02 VITALS — BP 126/72 | HR 67 | Ht 72.0 in | Wt 225.6 lb

## 2015-12-02 DIAGNOSIS — E039 Hypothyroidism, unspecified: Secondary | ICD-10-CM | POA: Diagnosis not present

## 2015-12-02 DIAGNOSIS — G4733 Obstructive sleep apnea (adult) (pediatric): Secondary | ICD-10-CM

## 2015-12-02 HISTORY — DX: Obstructive sleep apnea (adult) (pediatric): G47.33

## 2015-12-02 NOTE — Assessment & Plan Note (Signed)
The significant hypothyroidism could contribute to daytime sleepiness but his problem is being managed.

## 2015-12-02 NOTE — Progress Notes (Signed)
12/02/2015-45 year old male former smoker referred courtesy of Dr Kriste Basque; no sleep study. Fatigued-not feeling rested, has been told he snores. Wife has been wanting him to have sleep evaluation for years, reporting that he snores loudly and stops breathing. He is aware of waking frequently, non-restful sleep, tired during the daytime. Sleep habits reviewed. No sleep medications. No ENT surgery, diagnosed heart or lung disease. He is not told of limb movement or behavioral disturbances during sleep.  Prior to Admission medications   Medication Sig Start Date End Date Taking? Authorizing Provider  levothyroxine (SYNTHROID, LEVOTHROID) 75 MCG tablet Take 1 tablet (75 mcg total) by mouth daily. 09/15/15  Yes Terressa Koyanagi, DO   Past Medical History  Diagnosis Date  . Thyroid disease   . Fatigue    No past surgical history on file. Family History  Problem Relation Age of Onset  . Hyperlipidemia Mother   . Hyperlipidemia Father   . Diabetes Father   . Diabetes Maternal Grandmother   . Hyperlipidemia Maternal Grandmother   . Heart disease Maternal Grandmother   . Thyroid disease Maternal Uncle     and maternal grandmother   Social History   Social History  . Marital Status: Married    Spouse Name: N/A  . Number of Children: 1  . Years of Education: N/A   Occupational History  . Pharmacy Tech    Social History Main Topics  . Smoking status: Former Games developer  . Smokeless tobacco: Not on file     Comment: smoked on and off for 10 years, quit in2005  . Alcohol Use: 1.2 oz/week    2 Cans of beer per week     Comment: 1-2 beers a month on avg.  . Drug Use: No  . Sexual Activity: Not on file   Other Topics Concern  . 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 HEENT:    headaches, difficulty swallowing, tooth/dental problems, sore throat,       =sneezing, itching, ear ache, +nasal congestion, post nasal drip, snoring CV:    chest pain, orthopnea, PND, swelling in lower extremities, anasarca,                                                               dizziness, palpitations Resp:   +shortness of breath with exertion or at rest.                productive cough,   +non-productive cough, coughing up of blood.              change in color of mucus.  wheezing.   Skin:    rash or lesions. GI:  No-   heartburn, indigestion, abdominal pain, nausea, vomiting, diarrhea,  change in bowel habits, loss of appetite GU: dysuria, change in color of urine, no urgency or frequency.   flank pain. MS:   joint pain, stiffness, decreased range of motion, back pain. Neuro-     nothing unusual Psych:  change in mood or affect.  depression or anxiety.   memory loss.  OBJ- Physical Exam General- Alert, Oriented, Affect-appropriate, Distress- none acute Skin- rash-none, lesions- none, excoriation- none Lymphadenopathy- none Head- atraumatic            Eyes- Gross vision intact, PERRLA, conjunctivae and secretions clear            Ears- Hearing, canals-normal            Nose- Clear, no-Septal dev, mucus, polyps, erosion, perforation             Throat- Mallampati III , mucosa clear , drainage- none, tonsils- atrophic Neck- flexible , trachea midline, no stridor , thyroid nl, carotid no bruit Chest - symmetrical excursion , unlabored           Heart/CV- RRR , no murmur , no gallop  , no rub, nl s1 s2                           - JVD- none , edema- none, stasis changes- none, varices- none           Lung- clear to P&A, wheeze- none, cough- none , dullness-none, rub- none           Chest wall-   Abd-  Br/ Gen/ Rectal- Not done, not indicated Extrem- cyanosis- none, clubbing, none, atrophy- none, strength- nl Neuro- grossly intact to observation

## 2015-12-02 NOTE — Assessment & Plan Note (Signed)
Likely diagnosis based on exam and history Plan-educational discussion done. Schedule sleep study

## 2015-12-02 NOTE — Patient Instructions (Signed)
Order- schedule unattended home sleep test    Dx OSA We will work with you to arrange office follow-up  Please call as needed

## 2015-12-06 NOTE — Addendum Note (Signed)
Addended by: Ronny Bacon on: 12/06/2015 09:33 AM   Modules accepted: Orders

## 2015-12-07 ENCOUNTER — Encounter: Payer: Self-pay | Admitting: Family Medicine

## 2015-12-07 ENCOUNTER — Ambulatory Visit (HOSPITAL_BASED_OUTPATIENT_CLINIC_OR_DEPARTMENT_OTHER): Payer: BLUE CROSS/BLUE SHIELD | Attending: Internal Medicine | Admitting: Radiology

## 2015-12-07 DIAGNOSIS — G4733 Obstructive sleep apnea (adult) (pediatric): Secondary | ICD-10-CM | POA: Diagnosis present

## 2015-12-08 ENCOUNTER — Other Ambulatory Visit: Payer: Self-pay | Admitting: *Deleted

## 2015-12-08 MED ORDER — LEVOTHYROXINE SODIUM 75 MCG PO TABS: 75.0000 ug | ORAL_TABLET | Freq: Every day | ORAL | Status: DC

## 2015-12-08 NOTE — Telephone Encounter (Signed)
Rx done and the pt was informed via Mychart message. 

## 2015-12-11 NOTE — Progress Notes (Signed)
Patient Name: [name], [name] Date: 12/07/2015 Gender: Male D.O.B: [birth year] Age (years): 87 Referring Provider: Baird Lyons MD, ABSM Height (inches): 72 Interpreting Physician: [name] Lyons MD, ABSM Weight (lbs): 223 RPSGT: Zadie Rhine BMI: 30 MRN: [id number] Neck Size: 17.00 CLINICAL INFORMATION Sleep Study Type: Split Night CPAP Indication for sleep study: OSA Epworth Sleepiness Score: 13  SLEEP STUDY TECHNIQUE As per the AASM Manual for the Scoring of Sleep and Associated Events v2.3 (April 2016) with a hypopnea requiring 4% desaturations. The channels recorded and monitored were frontal, central and occipital EEG, electrooculogram (EOG), submentalis EMG (chin), nasal and oral airflow, thoracic and abdominal wall motion, anterior tibialis EMG, snore microphone, electrocardiogram, and pulse oximetry. Continuous positive airway pressure (CPAP) was initiated when the patient met split night criteria and was titrated according to treat sleep-disordered breathing.  MEDICATIONS Medications taken by the patient : charted for review Medications administered by patient during sleep study : No sleep medicine administered.  RESPIRATORY PARAMETERS Diagnostic Total AHI (/hr): 64.7 RDI (/hr): 67.4 OA Index (/hr): 61.2 CA Index (/hr): 0.0 REM AHI (/hr): 64.8 NREM AHI (/hr): 64.7 Supine AHI (/hr): 64.7 Non-supine AHI (/hr): N/A Min O2 Sat (%): 70.00 Mean O2 (%): 88.62 Time below 88% (min): 61.8   Titration Optimal Pressure (cm): 16 AHI at Optimal Pressure (/hr): 0.0 Min O2 at Optimal Pressure (%): 94.0 Supine % at Optimal (%): 100 Sleep % at Optimal (%): 100    SLEEP ARCHITECTURE The recording time for the entire night was 412.9 minutes. During a baseline period of 158.9 minutes, the patient slept for 136.3 minutes in REM and nonREM, yielding a sleep efficiency of 85.8%. Sleep onset after lights out was 18.1 minutes with a REM latency of 138.0 minutes. The patient spent 2.94%  of the night in stage N1 sleep, 69.34% in stage N2 sleep, 25.68% in stage N3 and 2.04% in REM. During the titration period of 250.3 minutes, the patient slept for 179.9 minutes in REM and nonREM, yielding a sleep efficiency of 71.9%. Sleep onset after CPAP initiation was 39.4 minutes with a REM latency of 30.5 minutes. The patient spent 4.73% of the night in stage N1 sleep, 39.12% in stage N2 sleep, 15.57% in stage N3 and 40.59% in REM.  CARDIAC DATA The 2 lead EKG demonstrated sinus rhythm. The mean heart rate was 55.47 beats per minute. Other EKG findings include: None.  LEG MOVEMENT DATA The total Periodic Limb Movements of Sleep (PLMS) were 0. The PLMS index was 0.00 .  IMPRESSIONS - Severe obstructive sleep apnea occurred during the diagnostic portion of the study (AHI = 64.7/hour). An optimal PAP pressure was selected for this patient ( 16 cm of water) - Mild central sleep apnea occurred during the diagnostic portion of the study (CAI = 0.0/hour). - Moderate oxygen desaturation was noted during the diagnostic portion of the study (Min O2 =70.00%). - No snoring was audible during the diagnostic portion of the study. - No cardiac abnormalities were noted during this study. - Clinically significant periodic limb movements did not occur during sleep.  DIAGNOSIS - Obstructive Sleep Apnea (327.23 [G47.33 ICD-10])  RECOMMENDATIONS - Trial of CPAP therapy on 16 cm H2O with a Medium size Fisher&Paykel Full Face Mask Simplus mask and heated humidification. - Avoid alcohol, sedatives and other CNS depressants that may worsen sleep apnea and disrupt normal sleep architecture. - Sleep hygiene should be reviewed to assess factors that may improve sleep quality. - Weight management and regular exercise should be initiated  or continued.  Deneise Lever Diplomate, American Board of Sleep Medicine  ELECTRONICALLY SIGNED ON:  12/11/2015, 11:50 AM [place] PH: [phone]  [phone]   FX: [phone] [place]

## 2015-12-23 ENCOUNTER — Encounter: Payer: Self-pay | Admitting: Internal Medicine

## 2015-12-23 ENCOUNTER — Ambulatory Visit (INDEPENDENT_AMBULATORY_CARE_PROVIDER_SITE_OTHER): Payer: BLUE CROSS/BLUE SHIELD | Admitting: Internal Medicine

## 2015-12-23 VITALS — BP 120/80 | HR 71 | Ht 72.0 in | Wt 224.4 lb

## 2015-12-23 DIAGNOSIS — F411 Generalized anxiety disorder: Secondary | ICD-10-CM

## 2015-12-23 DIAGNOSIS — G4733 Obstructive sleep apnea (adult) (pediatric): Secondary | ICD-10-CM | POA: Diagnosis not present

## 2015-12-23 NOTE — Assessment & Plan Note (Signed)
Reviewed basic sleep hygiene advice and CPAP comfort measures

## 2015-12-23 NOTE — Progress Notes (Signed)
12/02/2015-45 year old male former smoker referred courtesy of Dr Kriste Basque; no sleep study. Fatigued-not feeling rested, has been told he snores. Wife has been wanting him to have sleep evaluation for years, reporting that he snores loudly and stops breathing. He is aware of waking frequently, non-restful sleep, tired during the daytime. Sleep habits reviewed. No sleep medications. No ENT surgery, diagnosed heart or lung disease. He is not told of limb movement or behavioral disturbances during sleep.  12/23/2015-45 year old male former smoker followed for OSA complicated by hypothyroid FOLLOWS FOR: Review split night study with patient-attached to last OV. NPSG 12/07/15 AHI 64.7/ hr, desat to 70%, body weight 223 lbs I reviewed the sleep study results, showed graphic and discussed available treatments. We are starting CPAP auto.  ROS-see HPI   Negative unless "+" Constitutional:    weight loss, night sweats, fevers, chills,+ fatigue, lassitude. HEENT:    headaches, difficulty swallowing, tooth/dental problems, sore throat,       sneezing, itching, ear ache, +nasal congestion, post nasal drip, snoring CV:    chest pain, orthopnea, PND, swelling in lower extremities, anasarca,                                                               dizziness, palpitations Resp:   +shortness of breath with exertion or at rest.                productive cough,   +non-productive cough, coughing up of blood.              change in color of mucus.  wheezing.   Skin:    rash or lesions. GI:  No-   heartburn, indigestion, abdominal pain, nausea, vomiting, diarrhea,                 change in bowel habits, loss of appetite GU: dysuria, change in color of urine, no urgency or frequency.   flank pain. MS:   joint pain, stiffness, decreased range of motion, back pain. Neuro-     nothing unusual Psych:  change in mood or affect.  depression or anxiety.   memory loss.  OBJ- Physical Exam General- Alert, Oriented,  Affect-appropriate, Distress- none acute Skin- rash-none, lesions- none, excoriation- none Lymphadenopathy- none Head- atraumatic            Eyes- Gross vision intact, PERRLA, conjunctivae and secretions clear            Ears- Hearing, canals-normal            Nose- Clear, no-Septal dev, mucus, polyps, erosion, perforation             Throat- Mallampati III , mucosa clear , drainage- none, tonsils- atrophic Neck- flexible , trachea midline, no stridor , thyroid nl, carotid no bruit Chest - symmetrical excursion , unlabored           Heart/CV- RRR , no murmur , no gallop  , no rub, nl s1 s2                           - JVD- none , edema- none, stasis changes- none, varices- none           Lung- clear to P&A, wheeze- none, cough- none ,  dullness-none, rub- none           Chest wall-  Abd-  Br/ Gen/ Rectal- Not done, not indicated Extrem- cyanosis- none, clubbing, none, atrophy- none, strength- nl Neuro- grossly intact to observation

## 2015-12-23 NOTE — Patient Instructions (Signed)
Order- New DME, new CPAP auto 5-20, mask of choice, humidifier, supplies, AirView   Dx OSA  Please call as needed 

## 2015-12-23 NOTE — Assessment & Plan Note (Signed)
CPAP is the best available treatment for him. We discussed medical issues, comfort, alternative treatments. Plan-order CPAP with auto titration initially

## 2016-02-24 ENCOUNTER — Encounter: Payer: Self-pay | Admitting: Family Medicine

## 2016-02-24 ENCOUNTER — Ambulatory Visit (INDEPENDENT_AMBULATORY_CARE_PROVIDER_SITE_OTHER): Payer: BLUE CROSS/BLUE SHIELD | Admitting: Family Medicine

## 2016-02-24 VITALS — BP 110/82 | HR 79 | Temp 97.6°F | Ht 72.0 in | Wt 233.9 lb

## 2016-02-24 DIAGNOSIS — E039 Hypothyroidism, unspecified: Secondary | ICD-10-CM

## 2016-02-24 DIAGNOSIS — G4733 Obstructive sleep apnea (adult) (pediatric): Secondary | ICD-10-CM

## 2016-02-24 DIAGNOSIS — E785 Hyperlipidemia, unspecified: Secondary | ICD-10-CM

## 2016-02-24 DIAGNOSIS — E669 Obesity, unspecified: Secondary | ICD-10-CM

## 2016-02-24 HISTORY — DX: Obesity, unspecified: E66.9

## 2016-02-24 LAB — TSH: TSH: 4.04 u[IU]/mL (ref 0.35–4.50)

## 2016-02-24 NOTE — Progress Notes (Signed)
HPI:  Follow up:  Obesity: --not exercising -diet so- so, drinking soda -new job and he is happy - plans to start working out and wants some guidance  Hypothyroidism/HLD: -meds: levothyroxine 75mcg daily -no hot/cold intol -wants to recheck  OSA: -seeing pulm, recent notes reviewed -CPAP rxd recently -sleeping better, improved energy    ROS: See pertinent positives and negatives per HPI.  Past Medical History  Diagnosis Date  . Thyroid disease   . Fatigue     No past surgical history on file.  Family History  Problem Relation Age of Onset  . Hyperlipidemia Mother   . Hyperlipidemia Father   . Diabetes Father   . Diabetes Maternal Grandmother   . Hyperlipidemia Maternal Grandmother   . Heart disease Maternal Grandmother   . Thyroid disease Maternal Uncle     and maternal grandmother    Social History   Social History  . Marital Status: Married    Spouse Name: N/A  . Number of Children: 1  . Years of Education: N/A   Occupational History  . Pharmacy Tech    Social History Main Topics  . Smoking status: Former Games developermoker  . Smokeless tobacco: None     Comment: smoked on and off for 10 years, quit in2005  . Alcohol Use: 1.2 oz/week    2 Cans of beer per week     Comment: 1-2 beers a month on avg.  . Drug Use: No  . Sexual Activity: Not Asked   Other Topics Concern  . 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  levothyroxine (SYNTHROID, LEVOTHROID) 75 MCG tablet, Take 1 tablet (75 mcg total) by mouth daily., Disp: 90 tablet, Rfl: 1  EXAM:  Filed Vitals:   02/24/16 1450  BP: 110/82  Pulse: 79  Temp: 97.6 F (36.4 C)    Body mass index is 31.72  kg/(m^2).  GENERAL: vitals reviewed and listed above, alert, oriented, appears well hydrated and in no acute distress  HEENT: atraumatic, conjunttiva clear, no obvious abnormalities on inspection of external nose and ears  NECK: no obvious masses on inspection  LUNGS: clear to auscultation bilaterally, no wheezes, rales or rhonchi, good air movement  CV: HRRR, no peripheral edema  MS: moves all extremities without noticeable abnormality  PSYCH: pleasant and cooperative, no obvious depression or anxiety  ASSESSMENT AND PLAN:  Discussed the following assessment and plan:  Obesity  Hypothyroidism, unspecified hypothyroidism type - Plan: TSH  Obstructive sleep apnea  Hyperlipemia  -Encouraged and discussed lifestyle changes, advised goal of 150-300 minutes of aerobic exercise per week, but to ease into this gradually starting with 15 minute sessions. -Advised a healthy diet low in sweets and simple starches. -He wanted to check a thyroid lab today. -Physical exam in about 3 months. -Patient advised to return or notify a doctor immediately if symptoms worsen or persist or new concerns arise.  Patient Instructions  Before you leave: -Labs for thyroid check -Schedule physical in 3 months  We recommend the following healthy lifestyle measures: - eat a healthy whole foods diet consisting of regular small meals composed of vegetables,  fruits, beans, nuts, seeds, healthy meats such as white chicken and fish and whole grains.  - avoid sweets, white starchy foods, fried foods, fast food, processed foods, sodas, red meet and other fattening foods.  - get a least 150-300 minutes of aerobic exercise per week. Ease into this gradually, I would start with 15 minutes of aerobic exercise 3-4 days per week and then slowly increased sessions by 5 minutes each week.   -We have ordered labs or studies at this visit. It can take up to 1-2 weeks for results and processing. We will contact you with  instructions IF your results are abnormal. Normal results will be released to your [facility]. If you have not heard from [place] or can not find your results in [facility] in 2 weeks please contact our office.             Kriste Basque R.

## 2016-02-24 NOTE — Progress Notes (Signed)
Pre visit review using our clinic review tool, if applicable. No additional management support is needed unless otherwise documented below in the visit note. 

## 2016-02-24 NOTE — Patient Instructions (Addendum)
Before you leave: -Labs for thyroid check -Schedule physical in 3 months  We recommend the following healthy lifestyle measures: - eat a healthy whole foods diet consisting of regular small meals composed of vegetables, fruits, beans, nuts, seeds, healthy meats such as white chicken and fish and whole grains.  - avoid sweets, white starchy foods, fried foods, fast food, processed foods, sodas, red meet and other fattening foods.  - get a least 150-300 minutes of aerobic exercise per week. Ease into this gradually, I would start with 15 minutes of aerobic exercise 3-4 days per week and then slowly increased sessions by 5 minutes each week.   -We have ordered labs or studies at this visit. It can take up to 1-2 weeks for results and processing. We will contact you with instructions IF your results are abnormal. Normal results will be released to your [facility]. If you have not heard from us or can not find your results in [facility] in 2 weeks please contact our office.

## 2016-02-28 DIAGNOSIS — G4733 Obstructive sleep apnea (adult) (pediatric): Secondary | ICD-10-CM | POA: Diagnosis not present

## 2016-03-22 ENCOUNTER — Encounter: Payer: Self-pay | Admitting: Internal Medicine

## 2016-03-22 ENCOUNTER — Ambulatory Visit (INDEPENDENT_AMBULATORY_CARE_PROVIDER_SITE_OTHER): Payer: BLUE CROSS/BLUE SHIELD | Admitting: Internal Medicine

## 2016-03-22 VITALS — BP 102/78 | HR 78 | Ht 72.0 in | Wt 233.6 lb

## 2016-03-22 DIAGNOSIS — G4733 Obstructive sleep apnea (adult) (pediatric): Secondary | ICD-10-CM

## 2016-03-22 DIAGNOSIS — E669 Obesity, unspecified: Secondary | ICD-10-CM | POA: Diagnosis not present

## 2016-03-22 NOTE — Assessment & Plan Note (Signed)
We discussed compliance and goals. Plan-increase auto Pap range to 10-20

## 2016-03-22 NOTE — Assessment & Plan Note (Signed)
Need to encourage weight loss

## 2016-03-22 NOTE — Patient Instructions (Signed)
Order- [facility]- change CPAP auto range to 10-20     Dx OSA  Please call as needed

## 2016-03-22 NOTE — Progress Notes (Signed)
12/02/2015-45 year old male former smoker referred courtesy of Dr Kriste Basque; no sleep study. Fatigued-not feeling rested, has been told he snores. Wife has been wanting him to have sleep evaluation for years, reporting that he snores loudly and stops breathing. He is aware of waking frequently, non-restful sleep, tired during the daytime. Sleep habits reviewed. No sleep medications. No ENT surgery, diagnosed heart or lung disease. He is not told of limb movement or behavioral disturbances during sleep.  12/23/2015-45 year old male former smoker followed for OSA complicated by hypothyroid FOLLOWS FOR: Review split night study with patient-attached to last OV. NPSG 12/07/15 AHI 64.7/ hr, desat to 70%, body weight 223 lbs I reviewed the sleep study results, showed graphic and discussed available treatments. We are starting CPAP auto.  03/22/2016-45 year old male former smoker followed for OSA, complicated by hypothyroid CPAP auto/    ROS-see HPI   Negative unless "+" Constitutional:    weight loss, night sweats, fevers, chills,+ fatigue, lassitude. HEENT:    headaches, difficulty swallowing, tooth/dental problems, sore throat,       sneezing, itching, ear ache, +nasal congestion, post nasal drip, snoring CV:    chest pain, orthopnea, PND, swelling in lower extremities, anasarca,                                                               dizziness, palpitations Resp:   +shortness of breath with exertion or at rest.                productive cough,   +non-productive cough, coughing up of blood.              change in color of mucus.  wheezing.   Skin:    rash or lesions. GI:  No-   heartburn, indigestion, abdominal pain, nausea, vomiting, diarrhea,                 change in bowel habits, loss of appetite GU: dysuria, change in color of urine, no urgency or frequency.   flank pain. MS:   joint pain, stiffness, decreased range of motion, back pain. Neuro-     nothing unusual Psych:  change  in mood or affect.  depression or anxiety.   memory loss.  OBJ- Physical Exam General- Alert, Oriented, Affect-appropriate, Distress- none acute Skin- rash-none, lesions- none, excoriation- none Lymphadenopathy- none Head- atraumatic            Eyes- Gross vision intact, PERRLA, conjunctivae and secretions clear            Ears- Hearing, canals-normal            Nose- Clear, no-Septal dev, mucus, polyps, erosion, perforation             Throat- Mallampati III , mucosa clear , drainage- none, tonsils- atrophic Neck- flexible , trachea midline, no stridor , thyroid nl, carotid no bruit Chest - symmetrical excursion , unlabored           Heart/CV- RRR , no murmur , no gallop  , no rub, nl s1 s2                           - JVD- none , edema- none, stasis changes- none, varices- none  Lung- clear to P&A, wheeze- none, cough- none , dullness-none, rub- none           Chest wall-  Abd-  Br/ Gen/ Rectal- Not done, not indicated Extrem- cyanosis- none, clubbing, none, atrophy- none, strength- nl Neuro- grossly intact to observation    12/02/2015-45 year old male former smoker referred courtesy of Dr Kriste BasqueHannah Kim; no sleep study. Fatigued-not feeling rested, has been told he snores. Wife has been wanting him to have sleep evaluation for years, reporting that he snores loudly and stops breathing. He is aware of waking frequently, non-restful sleep, tired during the daytime. Sleep habits reviewed. No sleep medications. No ENT surgery, diagnosed heart or lung disease. He is not told of limb movement or behavioral disturbances during sleep.  12/23/2015-45 year old male former smoker followed for OSA complicated by hypothyroid FOLLOWS FOR: Review split night study with patient-attached to last OV. NPSG 12/07/15 AHI 64.7/ hr, desat to 70%, body weight 223 lbs I reviewed the sleep study results, showed graphic and discussed available treatments. We are starting CPAP  auto.  03/22/2016-45 year old male former smoker followed for OSA complicated by hypothyroid CPAP auto 5-20/[facility]> 10-20 at this visit FOLLOW FOR: doing well on cpap.  missed a few days due to cold.  will fall asleep without mask, then wake up in middle of the night and put it on.  Adequate compliance could be better, good control. Low pressure gets a little uncomfortable.  ROS-see HPI   Negative unless "+" Constitutional:    weight loss, night sweats, fevers, chills,+ fatigue, lassitude. HEENT:    headaches, difficulty swallowing, tooth/dental problems, sore throat,       sneezing, itching, ear ache, +nasal congestion, post nasal drip, snoring CV:    chest pain, orthopnea, PND, swelling in lower extremities, anasarca,                                                               dizziness, palpitations Resp:   +shortness of breath with exertion or at rest.                productive cough,   +non-productive cough, coughing up of blood.              change in color of mucus.  wheezing.   Skin:    rash or lesions. GI:  No-   heartburn, indigestion, abdominal pain, nausea, vomiting, diarrhea,                 change in bowel habits, loss of appetite GU: dysuria, change in color of urine, no urgency or frequency.   flank pain. MS:   joint pain, stiffness, decreased range of motion, back pain. Neuro-     nothing unusual Psych:  change in mood or affect.  depression or anxiety.   memory loss.  OBJ- Physical Exam General- Alert, Oriented, Affect-appropriate, Distress- none acute, + overweight Skin- rash-none, lesions- none, excoriation- none Lymphadenopathy- none Head- atraumatic            Eyes- Gross vision intact, PERRLA, conjunctivae and secretions clear            Ears- Hearing, canals-normal            Nose- Clear, no-Septal dev, mucus, polyps, erosion, perforation  Throat- Mallampati III , mucosa clear , drainage- none, tonsils- atrophic Neck- flexible , trachea midline,  no stridor , thyroid nl, carotid no bruit Chest - symmetrical excursion , unlabored           Heart/CV- RRR , no murmur , no gallop  , no rub, nl s1 s2                           - JVD- none , edema- none, stasis changes- none, varices- none           Lung- clear to P&A, wheeze- none, cough- none , dullness-none, rub- none           Chest wall-  Abd-  Br/ Gen/ Rectal- Not done, not indicated Extrem- cyanosis- none, clubbing, none, atrophy- none, strength- nl Neuro- grossly intact to observation

## 2016-03-23 NOTE — Addendum Note (Signed)
Addended by: York RamGAY, Jelesa Mangini on: 03/23/2016 01:27 PM   Modules accepted: Orders

## 2016-03-30 DIAGNOSIS — G4733 Obstructive sleep apnea (adult) (pediatric): Secondary | ICD-10-CM | POA: Diagnosis not present

## 2016-05-11 ENCOUNTER — Ambulatory Visit (INDEPENDENT_AMBULATORY_CARE_PROVIDER_SITE_OTHER): Payer: BLUE CROSS/BLUE SHIELD | Admitting: Family Medicine

## 2016-05-11 ENCOUNTER — Encounter: Payer: Self-pay | Admitting: Family Medicine

## 2016-05-11 VITALS — BP 102/80 | HR 92 | Temp 98.2°F | Ht 70.75 in | Wt 232.4 lb

## 2016-05-11 DIAGNOSIS — E039 Hypothyroidism, unspecified: Secondary | ICD-10-CM | POA: Diagnosis not present

## 2016-05-11 DIAGNOSIS — G4733 Obstructive sleep apnea (adult) (pediatric): Secondary | ICD-10-CM | POA: Diagnosis not present

## 2016-05-11 DIAGNOSIS — E669 Obesity, unspecified: Secondary | ICD-10-CM

## 2016-05-11 DIAGNOSIS — Z0000 Encounter for general adult medical examination without abnormal findings: Secondary | ICD-10-CM

## 2016-05-11 MED ORDER — LEVOTHYROXINE SODIUM 75 MCG PO TABS: 75.0000 ug | ORAL_TABLET | Freq: Every day | ORAL | Status: DC

## 2016-05-11 NOTE — Patient Instructions (Addendum)
BEFORE YOU LEAVE: -follow up:  1) lab appointment in 6 weeks for fasting labs 2) follow up in 4-6 months   We recommend the following healthy lifestyle: 1) Small portions - eat off of salad plate instead of dinner plate 2) Eat a healthy clean diet with avoidance of (less then 1 serving per week) processed foods, sweetened drinks, white starches, red meat, fast foods and sweets and consisting of: * 5-9 servings per day of fresh or frozen fruits and vegetables (not corn or potatoes, not dried or canned) *nuts and seeds, beans *olives and olive oil *small portions of lean meats such as fish and white chicken  *small portions of whole grains 3)Get at least 150 minutes of sweaty aerobic exercise per week 4)reduce stress - counseling, meditation, relaxation to balance other aspects of your life   We have ordered labs or studies at this visit. It can take up to 1-2 weeks for results and processing. IF results require follow up or explanation, we will call you with instructions. Clinically stable results will be released to your [facility]. If you have not heard from us or cannot find your results in [facility] in 2 weeks please contact our office at [phone].  If you are not yet signed up for [facility], please consider signing up.

## 2016-05-11 NOTE — Progress Notes (Signed)
Pre visit review using our clinic review tool, if applicable. No additional management support is needed unless otherwise documented below in the visit note. 

## 2016-05-11 NOTE — Progress Notes (Signed)
HPI:  Here for CPE:  -Concerns and/or follow up today:   [name] [name] is a pleasant 45 year old here for his physical. He reports due to insurance confusion, he has not taken his thyroid medication in over one month. He reports he feels fine. He recently started a new job and likes his work and is thankful to be working again. He wants a refill on his medication [facility] to [facility]. He is not getting any regular exercise, but reports he is trying to work on his diet. He now has CPAP for his obstructive sleep apnea and is feeling better overall with this. He reports now that he has a job his mood is much improved.  -Hx of HTN: no  -Vaccines: UTD  -sexual activity: yes, male partner, no new partners  -wants STI testing, Hep C screening (if born [id number]): no  -FH colon or prstate ca: see FH Last colon cancer screening: n/a Last prostate ca screening: n/a  -Alcohol, Tobacco, drug use: see social history  Review of Systems - no fevers, unintentional weight loss, vision loss, hearing loss, chest pain, sob, hemoptysis, melena, hematochezia, hematuria, genital discharge, changing or concerning skin lesions, bleeding, bruising, loc, thoughts of self harm or SI  Past Medical History  Diagnosis Date  . Thyroid disease   . Fatigue     No past surgical history on file.  Family History  Problem Relation Age of Onset  . Hyperlipidemia Mother   . Hyperlipidemia Father   . Diabetes Father   . Diabetes Maternal Grandmother   . Hyperlipidemia Maternal Grandmother   . Heart disease Maternal Grandmother   . Thyroid disease Maternal Uncle     and maternal grandmother    Social History   Social History  . Marital Status: Married    Spouse Name: N/A  . Number of Children: 1  . Years of Education: N/A   Occupational History  . Pharmacy Tech    Social History Main Topics  . Smoking status: Former Smoker -- 1.00 packs/day for 10 years    Types: Cigarettes    Quit date:  10/31/2003  . Smokeless tobacco: Never Used  . Alcohol Use: 1.2 oz/week    2 Cans of beer per week     Comment: 1-2 beers a month on avg.  . Drug Use: No  . Sexual Activity: Not Asked   Other Topics Concern  . 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  levothyroxine (SYNTHROID, LEVOTHROID) 75 MCG tablet, Take 1 tablet (75 mcg total) by mouth daily., Disp: 90 tablet, Rfl: 1  EXAM:  Filed Vitals:   05/11/16 0704  BP: 102/80  Pulse: 92  Temp: 98.2 F (36.8 C)  TempSrc: Oral  Height: 5' 10.75" (1.797 m)  Weight: 232 lb 6.4 oz (105.416 kg)    Estimated body mass index is 32.64 kg/(m^2) as calculated from the following:   Height as of this encounter: 5' 10.75" (1.797 m).   Weight as of this encounter: 232 lb 6.4 oz (105.416 kg).  GENERAL: vitals reviewed and listed below, alert, oriented, appears well hydrated and in no acute distress  HEENT: head atraumatic, PERRLA, normal appearance of eyes, ears, nose and mouth. moist mucus membranes.  NECK: supple, no masses or lymphadenopathy  LUNGS: clear to auscultation bilaterally, no rales, rhonchi or wheeze  CV: HRRR, no peripheral edema or cyanosis, normal pedal pulses  ABDOMEN: bowel sounds normal, soft, non tender to palpation, no masses, no rebound or guarding  GU: Declined  SKIN: no rash or abnormal lesions, a few skin tags around the neck, declined full skin exam  MS: normal gait, moves all extremities normally  NEURO: normal gait, speech and thought processing grossly intact, muscle tone grossly intact throughout  PSYCH: normal affect, pleasant and cooperative  ASSESSMENT AND PLAN:  Discussed the following assessment and  plan:  Visit for preventive health examination - Plan: Basic metabolic panel, Hemoglobin A1c  Obesity - Plan: Lipid Panel  Hypothyroidism, unspecified hypothyroidism type - Plan: TSH  Obstructive sleep apnea  -He opted to check labs once he is back on his thyroid medication for 6 weeks  -Discussed and advised all [place] preventive services health task force level A and B recommendations for age, sex and risks.  --Advised at least 150 minutes of exercise per week and a healthy diet with avoidance of (less then 1 serving per week) processed foods, white starches, red meat, fast foods and sweets and consisting of: * 5-9 servings of fresh fruits and vegetables (not corn or potatoes) *nuts and seeds, beans *olives and olive oil *lean meats such as fish and white chicken  *whole grains  -labs, studies and vaccines per orders this encounter   Patient advised to return to clinic immediately if symptoms worsen or persist or new concerns.  Patient Instructions  BEFORE YOU LEAVE: -follow up:  1) lab appointment in 6 weeks for fasting labs 2) follow up in 4-6 months   We recommend the following healthy lifestyle: 1) Small portions - eat off of salad plate instead of dinner plate 2) Eat a healthy clean diet with avoidance of (less then 1 serving per week) processed foods, sweetened drinks, white starches, red meat, fast foods and sweets and consisting of: * 5-9 servings per day of fresh or frozen fruits and vegetables (not corn or potatoes, not dried or canned) *nuts and seeds, beans *olives and olive oil *small portions of lean meats such as fish and white chicken  *small portions of whole grains 3)Get at least 150 minutes of sweaty aerobic exercise per week 4)reduce stress - counseling, meditation, relaxation to balance other aspects of your life   We have ordered labs or studies at this visit. It can take up to 1-2 weeks for results and processing. IF results require follow up or  explanation, we will call you with instructions. Clinically stable results will be released to your [facility]. If you have not heard from [place] or cannot find your results in [facility] in 2 weeks please contact our office at [phone].  If you are not yet signed up for [facility], please consider signing up.            No Follow-up on file.   Kriste Basque R., DO

## 2016-06-15 ENCOUNTER — Other Ambulatory Visit: Payer: BLUE CROSS/BLUE SHIELD

## 2016-06-22 ENCOUNTER — Other Ambulatory Visit: Payer: BLUE CROSS/BLUE SHIELD

## 2016-06-29 ENCOUNTER — Other Ambulatory Visit (INDEPENDENT_AMBULATORY_CARE_PROVIDER_SITE_OTHER): Payer: BLUE CROSS/BLUE SHIELD

## 2016-06-29 DIAGNOSIS — E039 Hypothyroidism, unspecified: Secondary | ICD-10-CM

## 2016-06-29 DIAGNOSIS — E669 Obesity, unspecified: Secondary | ICD-10-CM | POA: Diagnosis not present

## 2016-06-29 DIAGNOSIS — Z0000 Encounter for general adult medical examination without abnormal findings: Secondary | ICD-10-CM

## 2016-06-29 LAB — BASIC METABOLIC PANEL
BUN: 18 mg/dL (ref 6–23)
CALCIUM: 9.7 mg/dL (ref 8.4–10.5)
CO2: 27 mEq/L (ref 19–32)
CREATININE: 1.11 mg/dL (ref 0.40–1.50)
Chloride: 106 mEq/L (ref 96–112)
GFR: 76.43 mL/min (ref >60.00)
GLUCOSE: 89 mg/dL (ref 70–99)
Potassium: 4.4 mEq/L (ref 3.5–5.1)
SODIUM: 139 meq/L (ref 135–145)

## 2016-06-29 LAB — LIPID PANEL
Cholesterol: 175 mg/dL (ref 0–200)
HDL: 40.5 mg/dL (ref >39.00)
LDL CALC: 105 mg/dL — AB (ref 0–99)
NONHDL: 134.46
Total CHOL/HDL Ratio: 4
Triglycerides: 147 mg/dL (ref 0.0–149.0)
VLDL: 29.4 mg/dL (ref 0.0–40.0)

## 2016-06-29 LAB — TSH: TSH: 6.67 u[IU]/mL — ABNORMAL HIGH (ref 0.35–4.50)

## 2016-06-29 LAB — HEMOGLOBIN A1C: Hgb A1c MFr Bld: 5.3 % (ref 4.6–6.5)

## 2016-07-14 DIAGNOSIS — Z23 Encounter for immunization: Secondary | ICD-10-CM | POA: Diagnosis not present

## 2016-07-17 MED ORDER — LEVOTHYROXINE SODIUM 88 MCG PO TABS: 88.0000 ug | ORAL_TABLET | Freq: Every day | ORAL | 3 refills | Status: AC

## 2016-07-17 NOTE — Addendum Note (Signed)
Addended by: Johnella MoloneyFUNDERBURK, Jasreet Dickie A on: 07/17/2016 05:55 PM   Modules accepted: Orders

## 2016-09-11 ENCOUNTER — Ambulatory Visit: Payer: BLUE CROSS/BLUE SHIELD | Admitting: Internal Medicine

## 2016-09-17 ENCOUNTER — Encounter: Payer: Self-pay | Admitting: Family Medicine

## 2016-09-17 NOTE — Progress Notes (Signed)
HPI:   [name] [name] is here for follow up. PMH of Hypothyroidism, obesity, OSA on CPAP And GAD. Reports. Denies. Wt last visit 232. Reports he feels well, except for upper respiratory symptoms for the last few days. Symptoms include scratchy throat, cough and nasal congestion. No fever, body aches, NVD, chills, shortness of breath or sinus pain.  No regular exercise, he thought this might make his cholesterol worse? Diet not great. Feels better on increased dose of synthroid. No depression.  ROS: See pertinent positives and negatives per HPI.  Past Medical History:  Diagnosis Date  . Fatigue   . Generalized anxiety disorder 11/06/2014  . Obesity 02/24/2016  . Obstructive sleep apnea 12/02/2015   NPSG 12/07/15 AHI 64.7/ hr, desat to 70%, body weight 223 lbs CPAP auto started 12/23/2015   . Thyroid disease     No past surgical history on file.  Family History  Problem Relation Age of Onset  . Hyperlipidemia Mother   . Hyperlipidemia Father   . Diabetes Father   . Diabetes Maternal Grandmother   . Hyperlipidemia Maternal Grandmother   . Heart disease Maternal Grandmother   . Thyroid disease Maternal Uncle     and maternal grandmother    Social History   Social History  . Marital status: Married    Spouse name: N/A  . Number of children: 1  . 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 Smoking status: Former Smoker    Packs/day: 1.00    Years: 10.00    Types: Cigarettes    Quit date: 10/31/2003  . Smokeless tobacco: Never Used  . Alcohol use 1.2 oz/week    2 Cans of beer per week     Comment: 1-2 beers a month on avg.  . Drug use: No  . Sexual activity: Not Asked   Other Topics Concern  . 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  levothyroxine (SYNTHROID, LEVOTHROID) 88 MCG tablet, Take 1 tablet (88 mcg total) by mouth daily., Disp: 90 tablet, Rfl: 3  EXAM:  Vitals:   09/18/16 0804  BP: 102/80  Pulse: 97  Temp: 97.7 F (36.5 C)    Body mass index is 32.98 kg/m.  GENERAL: vitals reviewed and listed above, alert, oriented, appears well hydrated and in no acute distress  HEENT: atraumatic, conjunttiva clear, no obvious abnormalities on inspection of external nose and ears, normal appearance of ear canals and TMs, clear nasal congestion, mild post oropharyngeal erythema with PND, no tonsillar edema or exudate, no sinus TTP  NECK: no obvious masses on inspection  LUNGS: clear to auscultation bilaterally, no wheezes, rales or rhonchi, good air movement  CV: HRRR, no peripheral edema  MS: moves all extremities without noticeable abnormality  PSYCH: pleasant and cooperative, no obvious depression or anxiety  ASSESSMENT AND PLAN:  Discussed the following assessment and plan:  Hypothyroidism, unspecified type - Plan: TSH  BMI 32.0-32.9,adult  Hyperlipidemia, unspecified hyperlipidemia type  Acute upper respiratory infection  -check tsh -supportive tx for VURI, return precuations -lifestyle recs -Patient  advised to return or notify a doctor immediately if symptoms worsen or persist or new concerns arise.  Patient Instructions  BEFORE YOU LEAVE: -follow up: 4-6 months -labs  We have ordered labs or studies at this visit. It can take up to 1-2 weeks for results and processing. IF results require follow up or explanation, we will call you with instructions. Clinically stable results will be released to your [facility]. If you have not heard from us or cannot find your results in [facility] in 2 weeks please contact our office at [phone].   If you are not yet signed up for [facility], please SIGN  UP TODAY. We now offer online scheduling, same day appointments and extended hours. WHEN YOU DON'T FEEL YOUR BEST.Marland Kitchen.Marland Kitchen.WE ARE HERE TO HELP.   We recommend the following healthy lifestyle for LIFE: 1) Small portions.   Tip: eat off of a salad plate instead of a dinner plate.  Tip: if you need more or a snack choose fruits, veggies and/or a handful of nuts or seeds.  2) Eat a healthy clean diet.  * Tip: Avoid (less then 1 serving per week): processed foods, sweets, sweetened drinks, white starches (rice, flour, bread, potatoes, pasta, etc), red meat, fast foods, butter  *Tip: CHOOSE instead   * 5-9 servings per day of fresh or frozen fruits and vegetables (but not corn, potatoes, bananas, canned or dried fruit)   *nuts and seeds, beans   *olives and olive oil   *small portions of lean meats such as fish and white chicken    *small portions of whole grains  3)Get at least 150 minutes of sweaty aerobic exercise per week.  4)Reduce stress - consider counseling, meditation and relaxation to balance other aspects of your life.    INSTRUCTIONS FOR UPPER RESPIRATORY INFECTION:  -plenty of rest and fluids  -nasal saline wash 2-3 times daily (use prepackaged nasal saline or bottled/distilled water if making your own)   -can use AFRIN nasal spray for drainage and nasal congestion - but do NOT use longer then 3-4 days  -can use tylenol (in no history of liver disease) or ibuprofen (if no history of kidney disease, bowel bleeding or significant heart disease) as directed for aches and sorethroat  -in the winter time, using a humidifier at night is helpful (please follow cleaning instructions)  -if you are taking a cough medication - use only as directed, may also try a teaspoon of honey to coat the throat and throat lozenges. If given a cough medication with codeine or hydrocodone or other narcotic please be advised that this contains a strong and  potentially addicting medication. Please follow  instructions carefully, take as little as possible and only use AS NEEDED for severe cough. Discuss potential side effects with your pharmacy. Please do not drive or operate machinery while taking these types of medications. Please do not take other sedating medications, drugs or alcohol while taking this medication without discussing with your doctor.  -for sore throat, salt water gargles can help  -follow up if you have fevers, facial pain, tooth pain, difficulty breathing or are worsening or symptoms persist longer then expected  Upper Respiratory Infection, Adult An upper respiratory infection (URI) is also known as the common cold. It is often caused by a type of germ (virus). Colds are easily spread (contagious). You can pass it to others by kissing, coughing, sneezing, or drinking out of the same glass. Usually, you get better in 1 to 3  weeks.  However, the cough can last for even longer. HOME CARE   Only take medicine as told by your doctor. Follow instructions provided above.  Drink enough water and fluids to keep your pee (urine) clear or pale yellow.  Get plenty of rest.  Return to work when your temperature is < 100 for 24 hours or as told by your doctor. You may use a face mask and wash your hands to stop your cold from spreading. GET HELP RIGHT AWAY IF:   After the first few days, you feel you are getting worse.  You have questions about your medicine.  You have chills, shortness of breath, or red spit (mucus).  You have pain in the face for more then 1-2 days, especially when you bend forward.  You have a fever, puffy (swollen) neck, pain when you swallow, or white spots in the back of your throat.  You have a bad headache, ear pain, sinus pain, or chest pain.  You have a high-pitched whistling sound when you breathe in and out (wheezing).  You cough up blood.  You have sore muscles or a stiff neck. MAKE SURE YOU:   Understand these instructions.  Will watch  your condition.  Will get help right away if you are not doing well or get worse. Document Released: 04/03/2008 Document Revised: 01/08/2012 Document Reviewed: 01/21/2014 [facility] Patient Information 2015 [place], [place]. This information is not intended to replace advice given to you by your health care provider. Make sure you discuss any questions you have with your health care provider.         Kriste BasqueKIM, [name] Karlen R., DO

## 2016-09-18 ENCOUNTER — Encounter: Payer: Self-pay | Admitting: Family Medicine

## 2016-09-18 ENCOUNTER — Ambulatory Visit (INDEPENDENT_AMBULATORY_CARE_PROVIDER_SITE_OTHER): Payer: BLUE CROSS/BLUE SHIELD | Admitting: Family Medicine

## 2016-09-18 VITALS — BP 102/80 | HR 97 | Temp 97.7°F | Ht 70.75 in | Wt 234.8 lb

## 2016-09-18 DIAGNOSIS — E039 Hypothyroidism, unspecified: Secondary | ICD-10-CM

## 2016-09-18 DIAGNOSIS — J069 Acute upper respiratory infection, unspecified: Secondary | ICD-10-CM

## 2016-09-18 DIAGNOSIS — Z6832 Body mass index (BMI) 32.0-32.9, adult: Secondary | ICD-10-CM

## 2016-09-18 DIAGNOSIS — E785 Hyperlipidemia, unspecified: Secondary | ICD-10-CM | POA: Diagnosis not present

## 2016-09-18 LAB — TSH: TSH: 3.38 u[IU]/mL (ref 0.35–4.50)

## 2016-09-18 NOTE — Progress Notes (Signed)
Pre visit review using our clinic review tool, if applicable. No additional management support is needed unless otherwise documented below in the visit note. 

## 2016-09-18 NOTE — Patient Instructions (Signed)
BEFORE YOU LEAVE: -follow up: 4-6 months -labs  We have ordered labs or studies at this visit. It can take up to 1-2 weeks for results and processing. IF results require follow up or explanation, we will call you with instructions. Clinically stable results will be released to your [facility]. If you have not heard from us or cannot find your results in [facility] in 2 weeks please contact our office at [phone].   If you are not yet signed up for [facility], please SIGN UP TODAY. We now offer online scheduling, same day appointments and extended hours. WHEN YOU DON'T FEEL YOUR BEST.Marland Kitchen.Marland Kitchen.WE ARE HERE TO HELP.   We recommend the following healthy lifestyle for LIFE: 1) Small portions.   Tip: eat off of a salad plate instead of a dinner plate.  Tip: if you need more or a snack choose fruits, veggies and/or a handful of nuts or seeds.  2) Eat a healthy clean diet.  * Tip: Avoid (less then 1 serving per week): processed foods, sweets, sweetened drinks, white starches (rice, flour, bread, potatoes, pasta, etc), red meat, fast foods, butter  *Tip: CHOOSE instead   * 5-9 servings per day of fresh or frozen fruits and vegetables (but not corn, potatoes, bananas, canned or dried fruit)   *nuts and seeds, beans   *olives and olive oil   *small portions of lean meats such as fish and white chicken    *small portions of whole grains  3)Get at least 150 minutes of sweaty aerobic exercise per week.  4)Reduce stress - consider counseling, meditation and relaxation to balance other aspects of your life.    INSTRUCTIONS FOR UPPER RESPIRATORY INFECTION:  -plenty of rest and fluids  -nasal saline wash 2-3 times daily (use prepackaged nasal saline or bottled/distilled water if making your own)   -can use AFRIN nasal spray for drainage and nasal congestion - but do NOT use longer then 3-4 days  -can use tylenol (in no history of liver disease) or ibuprofen (if no history of kidney disease, bowel bleeding or  significant heart disease) as directed for aches and sorethroat  -in the winter time, using a humidifier at night is helpful (please follow cleaning instructions)  -if you are taking a cough medication - use only as directed, may also try a teaspoon of honey to coat the throat and throat lozenges. If given a cough medication with codeine or hydrocodone or other narcotic please be advised that this contains a strong and  potentially addicting medication. Please follow instructions carefully, take as little as possible and only use AS NEEDED for severe cough. Discuss potential side effects with your pharmacy. Please do not drive or operate machinery while taking these types of medications. Please do not take other sedating medications, drugs or alcohol while taking this medication without discussing with your doctor.  -for sore throat, salt water gargles can help  -follow up if you have fevers, facial pain, tooth pain, difficulty breathing or are worsening or symptoms persist longer then expected  Upper Respiratory Infection, Adult An upper respiratory infection (URI) is also known as the common cold. It is often caused by a type of germ (virus). Colds are easily spread (contagious). You can pass it to others by kissing, coughing, sneezing, or drinking out of the same glass. Usually, you get better in 1 to 3  weeks.  However, the cough can last for even longer. HOME CARE   Only take medicine as told by your doctor. Follow instructions  provided above.  Drink enough water and fluids to keep your pee (urine) clear or pale yellow.  Get plenty of rest.  Return to work when your temperature is < 100 for 24 hours or as told by your doctor. You may use a face mask and wash your hands to stop your cold from spreading. GET HELP RIGHT AWAY IF:   After the first few days, you feel you are getting worse.  You have questions about your medicine.  You have chills, shortness of breath, or red spit  (mucus).  You have pain in the face for more then 1-2 days, especially when you bend forward.  You have a fever, puffy (swollen) neck, pain when you swallow, or white spots in the back of your throat.  You have a bad headache, ear pain, sinus pain, or chest pain.  You have a high-pitched whistling sound when you breathe in and out (wheezing).  You cough up blood.  You have sore muscles or a stiff neck. MAKE SURE YOU:   Understand these instructions.  Will watch your condition.  Will get help right away if you are not doing well or get worse. Document Released: 04/03/2008 Document Revised: 01/08/2012 Document Reviewed: 01/21/2014 [facility] Patient Information 2015 [place]Care, [place]LL[place]. This information is not intended to replace advice given to you by your health care provider. Make sure you discuss any questions you have with your health care provider.

## 2016-09-20 ENCOUNTER — Ambulatory Visit (INDEPENDENT_AMBULATORY_CARE_PROVIDER_SITE_OTHER): Payer: BLUE CROSS/BLUE SHIELD | Admitting: Family Medicine

## 2016-09-20 ENCOUNTER — Encounter: Payer: Self-pay | Admitting: Family Medicine

## 2016-09-20 VITALS — BP 110/78 | HR 76 | Temp 98.1°F | Resp 16 | Ht 72.0 in | Wt 234.8 lb

## 2016-09-20 DIAGNOSIS — J029 Acute pharyngitis, unspecified: Secondary | ICD-10-CM | POA: Diagnosis not present

## 2016-09-20 LAB — POCT RAPID STREP A (OFFICE): Rapid Strep A Screen: NEGATIVE

## 2016-09-20 NOTE — Patient Instructions (Addendum)
     IF you received an x-ray today, you will receive an invoice from [facility]. Please contact [facility] at [phone] with questions or concerns regarding your invoice.   IF you received labwork today, you will receive an invoice from [facility]. Please contact [facility] at [phone] with questions or concerns regarding your invoice.   Our billing staff will not be able to assist you with questions regarding bills from these companies.  You will be contacted with the lab results as soon as they are available. The fastest way to get your results is to activate your My Chart account. Instructions are located on the last page of this paperwork. If you have not heard from us regarding the results in 2 weeks, please contact this office.      Pharyngitis Pharyngitis is a sore throat (pharynx). There is redness, pain, and swelling of your throat. Follow these instructions at home:  Drink enough fluids to keep your pee (urine) clear or pale yellow.  Only take medicine as told by your doctor.  You may get sick again if you do not take medicine as told. Finish your medicines, even if you start to feel better.  Do not take aspirin.  Rest.  Rinse your mouth (gargle) with salt water ( tsp of salt per 1 qt of water) every 1-2 hours. This will help the pain.  If you are not at risk for choking, you can suck on hard candy or sore throat lozenges. Contact a doctor if:  You have large, tender lumps on your neck.  You have a rash.  You cough up green, yellow-brown, or bloody spit. Get help right away if:  You have a stiff neck.  You drool or cannot swallow liquids.  You throw up (vomit) or are not able to keep medicine or liquids down.  You have very bad pain that does not go away with medicine.  You have problems breathing (not from a stuffy nose). This information is not intended to replace advice given to you by your health care  provider. Make sure you discuss any questions you have with your health care provider. Document Released: 04/03/2008 Document Revised: 03/23/2016 Document Reviewed: 06/23/2013 Elsevier Interactive Patient Education  2017 Elsevier Inc.  

## 2016-09-20 NOTE — Progress Notes (Signed)
  Chief Complaint  Patient presents with  . Sore Throat    exposed by daughter who has strep    HPI   Pt reports that his daughter has strep She has fevers and was diagnosed with strep He reports a 3 days history of sore throat His temp was 99.9 earlier today He reports a dry cough due to the sore throat Pain with swallowing He drinks plenty of water to help with the sore throat  Past Medical History:  Diagnosis Date  . Fatigue   . Generalized anxiety disorder 11/06/2014  . Obesity 02/24/2016  . Obstructive sleep apnea 12/02/2015   NPSG 12/07/15 AHI 64.7/ hr, desat to 70%, body weight 223 lbs CPAP auto started 12/23/2015   . Thyroid disease     Current Outpatient Prescriptions  Medication Sig Dispense Refill  . levothyroxine (SYNTHROID, LEVOTHROID) 88 MCG tablet Take 1 tablet (88 mcg total) by mouth daily. 90 tablet 3   No current facility-administered medications for this visit.     Allergies: No Known Allergies  History reviewed. No pertinent surgical history.  Social History   Social History  . Marital status: Married    Spouse name: N/A  . Number of children: 1  . 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 Smoking status: Former Smoker    Packs/day: 1.00    Years: 10.00    Types: Cigarettes    Quit date: 10/31/2003  . Smokeless tobacco: Never Used  . Alcohol use 1.2 oz/week    2 Cans of beer per week     Comment: 1-2 beers a month on avg.  . Drug use: No  . Sexual activity: Not Asked   Other Topics Concern  . 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, commercialsemi-conductors/steel fabrication      Home Situation: lives with wife and daughter (4 yo) in 2016      Spiritual Beliefs: none      Lifestyle: 2.5-5 mmd on podometer/diet is ok          ROS  Objective: Vitals:   09/20/16 1606  BP: 110/78  Pulse: 76  Resp: 16  Temp: 98.1 F  (36.7 C)  TempSrc: Oral  SpO2: 96%  Weight: 234 lb 12.8 oz (106.5 kg)  Height: 6' (1.829 m)    Physical Exam General: alert, oriented, in NAD Head: normocephalic, atraumatic, no sinus tenderness Eyes: EOM intact, no scleral icterus or conjunctival injection Ears: TM clear bilaterally Throat: no pharyngeal exudate or erythema Lymph: no posterior auricular, submental or cervical lymph adenopathy Heart: normal rate, normal sinus rhythm, no murmurs Lungs: clear to auscultation bilaterally, no wheezing   Assessment and Plan Caryn BeeKevin was seen today for sore throat.  Diagnoses and all orders for this visit:  Acute pharyngitis, unspecified etiology- flonase, claritin and chloraceptic spray Rapid strep neg -     POCT rapid strep A -     Culture, Group A Strep     Rudie Rikard A Dawanna Grauberger

## 2016-09-28 LAB — CULTURE, GROUP A STREP: ORGANISM ID, BACTERIA: NORMAL

## 2016-10-17 ENCOUNTER — Other Ambulatory Visit: Payer: BLUE CROSS/BLUE SHIELD

## 2016-12-18 DIAGNOSIS — B349 Viral infection, unspecified: Secondary | ICD-10-CM | POA: Diagnosis not present

## 2016-12-18 DIAGNOSIS — R05 Cough: Secondary | ICD-10-CM | POA: Diagnosis not present

## 2016-12-26 ENCOUNTER — Ambulatory Visit: Payer: BLUE CROSS/BLUE SHIELD | Admitting: Family Medicine

## 2017-01-15 NOTE — Progress Notes (Deleted)
  HPI:  Follow up: Due for flu vaccine, check thyroid  GAD: -not on treatment  Hypothyroidism: -meds: levothyroxine 88mcg  Obesity: -last wt 234 (08/2016) -->  OSA: -on CPAP -sees pulmonologist  ROS: See pertinent positives and negatives per HPI.  Past Medical History:  Diagnosis Date  . Fatigue   . Generalized anxiety disorder 11/06/2014  . Obesity 02/24/2016  . Obstructive sleep apnea 12/02/2015   NPSG 12/07/15 AHI 64.7/ hr, desat to 70%, body weight 223 lbs CPAP auto started 12/23/2015   . Thyroid disease     No past surgical history on file.  Family History  Problem Relation Age of Onset  . Hyperlipidemia Mother   . Hyperlipidemia Father   . Diabetes Father   . Diabetes Maternal Grandmother   . Hyperlipidemia Maternal Grandmother   . Heart disease Maternal Grandmother   . Thyroid disease Maternal Uncle     and maternal grandmother    Social History   Social History  . Marital status: Married    Spouse name: N/A  . Number of children: 1  . 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 Smoking status: Former Smoker    Packs/day: 1.00    Years: 10.00    Types: Cigarettes    Quit date: 10/31/2003  . Smokeless tobacco: Never Used  . Alcohol use 1.2 oz/week    2 Cans of beer per week     Comment: 1-2 beers a month on avg.  . Drug use: No  . Sexual activity: Not on file   Other Topics Concern  . 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  levothyroxine (SYNTHROID, LEVOTHROID) 88 MCG tablet, Take 1 tablet (88 mcg total) by mouth daily., Disp: 90 tablet, Rfl: 3  EXAM:  There were no vitals filed for  this visit.  There is no height or weight on file to calculate BMI.  GENERAL: vitals reviewed and listed above, alert, oriented, appears well hydrated and in no acute distress  HEENT: atraumatic, conjunttiva clear, no obvious abnormalities on inspection of external nose and ears  NECK: no obvious masses on inspection  LUNGS: clear to auscultation bilaterally, no wheezes, rales or rhonchi, good air movement  CV: HRRR, no peripheral edema  MS: moves all extremities without noticeable abnormality  PSYCH: pleasant and cooperative, no obvious depression or anxiety  ASSESSMENT AND PLAN:  Discussed the following assessment and plan:  No diagnosis found.  -Patient advised to return or notify a doctor immediately if symptoms worsen or persist or new concerns arise.  There are no Patient Instructions on file for this visit.  Kriste BasqueKIM, HANNAH R., DO

## 2017-01-16 ENCOUNTER — Ambulatory Visit: Payer: BLUE CROSS/BLUE SHIELD | Admitting: Family Medicine

## 2017-07-19 ENCOUNTER — Encounter: Payer: Self-pay | Admitting: Family Medicine

## 2017-11-08 ENCOUNTER — Encounter: Payer: Self-pay | Admitting: Family Medicine

## 2020-03-26 ENCOUNTER — Telehealth: Payer: Self-pay | Admitting: Family Medicine

## 2020-03-26 NOTE — Telephone Encounter (Signed)
Patient would like to know if he can do a TOC to Dixie from Dr.Kim.

## 2020-03-30 NOTE — Telephone Encounter (Signed)
Ok with me 

## 2020-04-01 NOTE — Telephone Encounter (Signed)
Thanks Samantha!

## 2020-04-26 ENCOUNTER — Other Ambulatory Visit: Payer: Self-pay

## 2020-04-26 ENCOUNTER — Ambulatory Visit (INDEPENDENT_AMBULATORY_CARE_PROVIDER_SITE_OTHER): Payer: BC Managed Care – PPO | Admitting: Physician Assistant

## 2020-04-26 ENCOUNTER — Encounter: Payer: Self-pay | Admitting: Physician Assistant

## 2020-04-26 VITALS — BP 138/86 | HR 84 | Temp 98.4°F | Ht 72.0 in | Wt 230.4 lb

## 2020-04-26 DIAGNOSIS — Z13220 Encounter for screening for lipoid disorders: Secondary | ICD-10-CM

## 2020-04-26 DIAGNOSIS — E039 Hypothyroidism, unspecified: Secondary | ICD-10-CM

## 2020-04-26 DIAGNOSIS — G4733 Obstructive sleep apnea (adult) (pediatric): Secondary | ICD-10-CM

## 2020-04-26 DIAGNOSIS — Z136 Encounter for screening for cardiovascular disorders: Secondary | ICD-10-CM

## 2020-04-26 DIAGNOSIS — E669 Obesity, unspecified: Secondary | ICD-10-CM

## 2020-04-26 DIAGNOSIS — Z0000 Encounter for general adult medical examination without abnormal findings: Secondary | ICD-10-CM | POA: Diagnosis not present

## 2020-04-26 DIAGNOSIS — Z720 Tobacco use: Secondary | ICD-10-CM

## 2020-04-26 DIAGNOSIS — F411 Generalized anxiety disorder: Secondary | ICD-10-CM

## 2020-04-26 LAB — CBC WITH DIFFERENTIAL/PLATELET
Basophils Absolute: 0.1 10*3/uL (ref 0.0–0.1)
Basophils Relative: 0.8 % (ref 0.0–3.0)
Eosinophils Absolute: 0.5 10*3/uL (ref 0.0–0.7)
Eosinophils Relative: 5.9 % — ABNORMAL HIGH (ref 0.0–5.0)
HCT: 43 % (ref 39.0–52.0)
Hemoglobin: 14.8 g/dL (ref 13.0–17.0)
Lymphocytes Relative: 26.8 % (ref 12.0–46.0)
Lymphs Abs: 2.4 10*3/uL (ref 0.7–4.0)
MCHC: 34.4 g/dL (ref 30.0–36.0)
MCV: 88.8 fl (ref 78.0–100.0)
Monocytes Absolute: 0.7 10*3/uL (ref 0.1–1.0)
Monocytes Relative: 7.7 % (ref 3.0–12.0)
Neutro Abs: 5.3 10*3/uL (ref 1.4–7.7)
Neutrophils Relative %: 58.8 % (ref 43.0–77.0)
Platelets: 204 10*3/uL (ref 150.0–400.0)
RBC: 4.85 Mil/uL (ref 4.22–5.81)
RDW: 13 % (ref 11.5–15.5)
WBC: 9.1 10*3/uL (ref 4.0–10.5)

## 2020-04-26 LAB — COMPREHENSIVE METABOLIC PANEL
ALT: 27 U/L (ref 0–53)
AST: 18 U/L (ref 0–37)
Albumin: 4.7 g/dL (ref 3.5–5.2)
Alkaline Phosphatase: 47 U/L (ref 39–117)
BUN: 16 mg/dL (ref 6–23)
CO2: 26 mEq/L (ref 19–32)
Calcium: 9.5 mg/dL (ref 8.4–10.5)
Chloride: 106 mEq/L (ref 96–112)
Creatinine, Ser: 0.97 mg/dL (ref 0.40–1.50)
GFR: 82.61 mL/min (ref >60.00)
Glucose, Bld: 90 mg/dL (ref 70–99)
Potassium: 4.4 mEq/L (ref 3.5–5.1)
Sodium: 140 mEq/L (ref 135–145)
Total Bilirubin: 0.4 mg/dL (ref 0.2–1.2)
Total Protein: 7 g/dL (ref 6.0–8.3)

## 2020-04-26 LAB — LIPID PANEL
Cholesterol: 159 mg/dL (ref 0–200)
HDL: 39.9 mg/dL (ref >39.00)
LDL Cholesterol: 94 mg/dL (ref 0–99)
NonHDL: 119.4
Total CHOL/HDL Ratio: 4
Triglycerides: 128 mg/dL (ref 0.0–149.0)
VLDL: 25.6 mg/dL (ref 0.0–40.0)

## 2020-04-26 LAB — T4, FREE: Free T4: 0.76 ng/dL (ref 0.60–1.60)

## 2020-04-26 LAB — TSH: TSH: 6.35 u[IU]/mL — ABNORMAL HIGH (ref 0.35–4.50)

## 2020-04-26 NOTE — Progress Notes (Signed)
Subjective:    [name] [name] is a 49 y.o. male and is here for a comprehensive physical exam.  HPI  Health Maintenance Due  Topic Date Due  . Hepatitis C Screening  Never done  . TETANUS/TDAP  10/30/2018    Acute Concerns: None   Chronic Issues: Hypothyroidism -- most recent dosage was 88 mcg levothyroxine -- last took this in 2017. He ran out of medication in 2017 and so he took himself off this. Has ongoing fatigue but not sure if it is related to his uncontrolled OSA. Denies unexplained weight change GAD -- gets overwhelmed easily when has multiple things to do. Has very difficult time prioritizing. Has never taken medication. Has never been tested for ADD/ADHD. Tobacco use -- recently started smoking again -- will smoke about 4 cigarettes daily when his daughter is not with him. OSA -- needs updated sleep study. Couldn't tolerate CPAP in the past but this was years ago.  Health Maintenance: Immunizations -- UTD Colonoscopy -- never, no known fam hx PSA -- ever, no known fam hx Diet -- was doing intermittent fasting and decreased sugar but recently stopped; mainly only drinks water during the day. Soda q 1-2 weeks.  Caffeine intake -- none Sleep habits -- uncontrolled OSA Exercise -- intermittent Weight -- Weight: 230 lb 6.4 oz (104.5 kg)  Weight history Wt Readings from Last 10 Encounters:  04/26/20 230 lb 6.4 oz (104.5 kg)  09/20/16 234 lb 12.8 oz (106.5 kg)  09/18/16 234 lb 12.8 oz (106.5 kg)  05/11/16 232 lb 6.4 oz (105.4 kg)  03/22/16 233 lb 9.6 oz (106 kg)  02/24/16 233 lb 14.4 oz (106.1 kg)  12/23/15 224 lb 6.4 oz (101.8 kg)  12/07/15 223 lb (101.2 kg)  12/02/15 225 lb 9.6 oz (102.3 kg)  10/26/15 227 lb 3.2 oz (103.1 kg)   Body mass index is 31.25 kg/m. Mood -- overall ok; denies concerns for depression Tobacco use --    Tobacco Use: High Risk  . Smoking Tobacco Use: Current Some Day Smoker  . Smokeless Tobacco Use: Never Used    Alcohol use ---   reports current alcohol use of about 1.0 standard drink of alcohol per week.   Depression screen PHQ 2/9 04/26/2020  Decreased Interest 0  Down, Depressed, Hopeless 0  PHQ - 2 Score 0     Other providers/specialists: Patient Care Team: Inda Coke, [place] as PCP - General (Physician Assistant)   PMHx, SurgHx, SocialHx, Medications, and Allergies were reviewed in the Visit Navigator and updated as appropriate.   Past Medical History:  Diagnosis Date  . Fatigue   . Generalized anxiety disorder 11/06/2014  . Obesity 02/24/2016  . Obstructive sleep apnea 12/02/2015   NPSG 12/07/15 AHI 64.7/ hr, desat to 70%, body weight 223 lbs CPAP auto started 12/23/2015   . Thyroid disease     History reviewed. No pertinent surgical history.   Family History  Problem Relation Age of Onset  . Hyperlipidemia Mother   . Hyperlipidemia Father   . Diabetes Father   . Diabetes Maternal Grandmother   . Hyperlipidemia Maternal Grandmother   . Heart disease Maternal Grandmother   . Thyroid cancer Maternal Uncle   . Colon cancer Neg Hx   . Prostate cancer Neg Hx     Social History   Tobacco Use  . Smoking status: Current Some Day Smoker    Packs/day: 1.00    Years: 10.00    Pack years: 10.00  Types: Cigarettes  . Smokeless tobacco: Never Used  Substance Use Topics  . Alcohol use: Yes    Alcohol/week: 1.0 standard drink    Types: 1 Cans of beer per week    Comment: 1-2 beers a month on avg.  . Drug use: No    Review of Systems:   Review of Systems  Constitutional: Negative for chills, fever, malaise/fatigue and weight loss.  HENT: Negative for hearing loss, sinus pain and sore throat.   Respiratory: Negative for cough and hemoptysis.   Cardiovascular: Negative for chest pain, palpitations, leg swelling and PND.  Gastrointestinal: Negative for abdominal pain, constipation, diarrhea, heartburn, nausea and vomiting.  Genitourinary: Negative for dysuria, frequency and urgency.    Musculoskeletal: Negative for back pain, myalgias and neck pain.  Skin: Negative for itching and rash.  Neurological: Negative for dizziness, tingling, seizures and headaches.  Endo/Heme/Allergies: Negative for polydipsia.  Psychiatric/Behavioral: Negative for depression. The patient is not nervous/anxious.     Objective:   Vitals:   04/26/20 0933  BP: 138/86  Pulse: 84  Temp: 98.4 F (36.9 C)  SpO2: 95%   Body mass index is 31.25 kg/m.  General Appearance:  Alert, cooperative, no distress, appears stated age  Head:  Normocephalic, without obvious abnormality, atraumatic  Eyes:  PERRL, conjunctiva/corneas clear, EOM's intact, fundi benign, both eyes       Ears:  Normal TM's and external ear canals, both ears  Nose: Nares normal, septum midline, mucosa normal, no drainage    or sinus tenderness  Throat: Lips, mucosa, and tongue normal; teeth and gums normal  Neck: Supple, symmetrical, trachea midline, no adenopathy; thyroid:  No enlargement/tenderness/nodules; no carotit bruit or JVD  Back:   Symmetric, no curvature, ROM normal, no CVA tenderness  Lungs:   Clear to auscultation bilaterally, respirations unlabored  Chest wall:  No tenderness or deformity  Heart:  Regular rate and rhythm, S1 and S2 normal, no murmur, rub   or gallop  Abdomen:   Soft, non-tender, bowel sounds active all four quadrants, no masses, no organomegaly  Extremities: Extremities normal, atraumatic, no cyanosis or edema  Prostate: Not done.   Skin: Skin color, texture, turgor normal, no rashes or lesions  Lymph nodes: Cervical, supraclavicular, and axillary nodes normal  Neurologic: CNII-XII grossly intact. Normal strength, sensation and reflexes throughout    Assessment/Plan:   Marselino was seen today for transitions of care and annual exam.  Diagnoses and all orders for this visit:  Routine physical examination Today patient counseled on age appropriate routine health concerns for screening and  prevention, each reviewed and up to date or declined. Immunizations reviewed and up to date or declined. Labs ordered and reviewed. Risk factors for depression reviewed and negative. Hearing function and visual acuity are intact. ADLs screened and addressed as needed. Functional ability and level of safety reviewed and appropriate. Education, counseling and referrals performed based on assessed risks today. Patient provided with a copy of personalized plan for preventive services.  Obstructive sleep apnea Referral to [facility] for further evaluation and management. -     CBC w/Diff -     Comp Met (CMET)  Hypothyroidism, unspecified type Will update labs today and determine if treatment needs to be resumed. -     TSH -     T4, free  Encounter for lipid screening for cardiovascular disease -     Lipid panel  Generalized anxiety disorder Overall controlled. I did give him a self-assessment for ADD/ADHD and  asked him to complete if he feels like the questions are similar to what he is experiencing and we can send to psych for further eval if needed.  Obesity without serious comorbidity, unspecified classification, unspecified obesity type Encouraged healthy diet and getting back into exercise.  Tobacco abuse Encouraged cessation.   Well Adult Exam: Labs ordered: Yes. Patient counseling was done. See below for items discussed. Discussed the patient's BMI.  The BMI is not in the acceptable range; BMI management plan is completed Follow up dependent upon lab results.  Patient Counseling: _0   Nutrition: Stressed importance of moderation in sodium/caffeine intake, saturated fat and cholesterol, caloric balance, sufficient intake of fresh fruits, vegetables, and fiber.  _1   Stressed the importance of regular exercise.   _2   Substance Abuse: Discussed cessation/primary prevention of tobacco, alcohol, or other drug use; driving or other dangerous activities under the influence;  availability of treatment for abuse.   _3   Injury prevention: Discussed safety belts, safety helmets, smoke detector, smoking near bedding or upholstery.   _4   Sexuality: Discussed sexually transmitted diseases, partner selection, use of condoms, avoidance of unintended pregnancy  and contraceptive alternatives.   _5   Dental health: Discussed importance of regular tooth brushing, flossing, and dental visits.  _6   Health maintenance and immunizations reviewed. Please refer to Health maintenance section.    CMA or LPN served as scribe during this visit. History, Physical, and Plan performed by medical provider. The above documentation has been reviewed and is accurate and complete.  Inda Coke, PA-C [place]

## 2020-04-26 NOTE — Patient Instructions (Signed)

## 2022-07-24 ENCOUNTER — Encounter: Payer: Self-pay | Admitting: *Deleted

## 2022-10-12 ENCOUNTER — Encounter: Payer: Self-pay | Admitting: *Deleted
